# Patient Record
Sex: Female | Born: 1935 | Race: White | Hispanic: No | Smoking: Former smoker
Health system: Southern US, Community
[De-identification: ages and names within clinical notes are randomized; demographics above are authoritative.]

## PROBLEM LIST (undated history)

## (undated) DIAGNOSIS — I10 Essential (primary) hypertension: Secondary | ICD-10-CM

## (undated) DIAGNOSIS — E119 Type 2 diabetes mellitus without complications: Secondary | ICD-10-CM

## (undated) HISTORY — PX: FOOT SURGERY: SHX648

## (undated) HISTORY — PX: APPENDECTOMY: SHX54

---

## 1997-05-17 ENCOUNTER — Other Ambulatory Visit: Admission: RE | Admit: 1997-05-17 | Discharge: 1997-05-17 | Payer: Self-pay | Admitting: Gynecology

## 1997-11-19 ENCOUNTER — Encounter: Payer: Self-pay | Admitting: Neurological Surgery

## 1997-11-19 ENCOUNTER — Ambulatory Visit (HOSPITAL_COMMUNITY): Admission: RE | Admit: 1997-11-19 | Discharge: 1997-11-19 | Payer: Self-pay | Admitting: Neurological Surgery

## 1998-06-04 ENCOUNTER — Other Ambulatory Visit: Admission: RE | Admit: 1998-06-04 | Discharge: 1998-06-04 | Payer: Self-pay | Admitting: Gynecology

## 1998-10-20 ENCOUNTER — Encounter: Payer: Self-pay | Admitting: Emergency Medicine

## 1998-10-20 ENCOUNTER — Encounter: Payer: Self-pay | Admitting: Surgery

## 1998-10-20 ENCOUNTER — Inpatient Hospital Stay (HOSPITAL_COMMUNITY): Admission: EM | Admit: 1998-10-20 | Discharge: 1998-10-21 | Payer: Self-pay | Admitting: Emergency Medicine

## 1999-06-10 ENCOUNTER — Other Ambulatory Visit: Admission: RE | Admit: 1999-06-10 | Discharge: 1999-06-10 | Payer: Self-pay | Admitting: Gynecology

## 2000-06-17 ENCOUNTER — Other Ambulatory Visit: Admission: RE | Admit: 2000-06-17 | Discharge: 2000-06-17 | Payer: Self-pay | Admitting: Gynecology

## 2000-12-16 ENCOUNTER — Other Ambulatory Visit: Admission: RE | Admit: 2000-12-16 | Discharge: 2000-12-16 | Payer: Self-pay | Admitting: Gynecology

## 2001-06-23 ENCOUNTER — Other Ambulatory Visit: Admission: RE | Admit: 2001-06-23 | Discharge: 2001-06-23 | Payer: Self-pay | Admitting: Gynecology

## 2002-07-10 ENCOUNTER — Other Ambulatory Visit: Admission: RE | Admit: 2002-07-10 | Discharge: 2002-07-10 | Payer: Self-pay | Admitting: Gynecology

## 2003-07-30 ENCOUNTER — Other Ambulatory Visit: Admission: RE | Admit: 2003-07-30 | Discharge: 2003-07-30 | Payer: Self-pay | Admitting: Gynecology

## 2004-01-04 ENCOUNTER — Encounter: Admission: RE | Admit: 2004-01-04 | Discharge: 2004-01-04 | Payer: Self-pay | Admitting: Internal Medicine

## 2004-05-28 ENCOUNTER — Encounter: Admission: RE | Admit: 2004-05-28 | Discharge: 2004-05-28 | Payer: Self-pay | Admitting: Internal Medicine

## 2004-06-09 ENCOUNTER — Encounter (HOSPITAL_COMMUNITY): Admission: RE | Admit: 2004-06-09 | Discharge: 2004-09-07 | Payer: Self-pay | Admitting: Geriatric Medicine

## 2010-02-23 ENCOUNTER — Encounter: Payer: Self-pay | Admitting: Geriatric Medicine

## 2010-04-18 ENCOUNTER — Other Ambulatory Visit: Payer: Self-pay | Admitting: Dermatology

## 2010-12-12 ENCOUNTER — Other Ambulatory Visit: Payer: Self-pay | Admitting: Dermatology

## 2011-04-28 DIAGNOSIS — Z85828 Personal history of other malignant neoplasm of skin: Secondary | ICD-10-CM | POA: Diagnosis not present

## 2011-04-28 DIAGNOSIS — L821 Other seborrheic keratosis: Secondary | ICD-10-CM | POA: Diagnosis not present

## 2011-06-15 DIAGNOSIS — K219 Gastro-esophageal reflux disease without esophagitis: Secondary | ICD-10-CM | POA: Diagnosis not present

## 2011-06-15 DIAGNOSIS — E785 Hyperlipidemia, unspecified: Secondary | ICD-10-CM | POA: Diagnosis not present

## 2011-06-15 DIAGNOSIS — I10 Essential (primary) hypertension: Secondary | ICD-10-CM | POA: Diagnosis not present

## 2011-06-15 DIAGNOSIS — E049 Nontoxic goiter, unspecified: Secondary | ICD-10-CM | POA: Diagnosis not present

## 2011-06-15 DIAGNOSIS — E119 Type 2 diabetes mellitus without complications: Secondary | ICD-10-CM | POA: Diagnosis not present

## 2011-10-27 DIAGNOSIS — L821 Other seborrheic keratosis: Secondary | ICD-10-CM | POA: Diagnosis not present

## 2011-10-27 DIAGNOSIS — Z85828 Personal history of other malignant neoplasm of skin: Secondary | ICD-10-CM | POA: Diagnosis not present

## 2011-11-20 DIAGNOSIS — Z23 Encounter for immunization: Secondary | ICD-10-CM | POA: Diagnosis not present

## 2011-12-23 DIAGNOSIS — Z0000 Encounter for general adult medical examination without abnormal findings: Secondary | ICD-10-CM | POA: Diagnosis not present

## 2011-12-23 DIAGNOSIS — K219 Gastro-esophageal reflux disease without esophagitis: Secondary | ICD-10-CM | POA: Diagnosis not present

## 2011-12-23 DIAGNOSIS — Z1331 Encounter for screening for depression: Secondary | ICD-10-CM | POA: Diagnosis not present

## 2011-12-23 DIAGNOSIS — I10 Essential (primary) hypertension: Secondary | ICD-10-CM | POA: Diagnosis not present

## 2011-12-23 DIAGNOSIS — E042 Nontoxic multinodular goiter: Secondary | ICD-10-CM | POA: Diagnosis not present

## 2011-12-23 DIAGNOSIS — E119 Type 2 diabetes mellitus without complications: Secondary | ICD-10-CM | POA: Diagnosis not present

## 2011-12-25 DIAGNOSIS — H2510 Age-related nuclear cataract, unspecified eye: Secondary | ICD-10-CM | POA: Diagnosis not present

## 2011-12-28 DIAGNOSIS — Z1289 Encounter for screening for malignant neoplasm of other sites: Secondary | ICD-10-CM | POA: Diagnosis not present

## 2011-12-28 DIAGNOSIS — Z1231 Encounter for screening mammogram for malignant neoplasm of breast: Secondary | ICD-10-CM | POA: Diagnosis not present

## 2011-12-28 DIAGNOSIS — Z1212 Encounter for screening for malignant neoplasm of rectum: Secondary | ICD-10-CM | POA: Diagnosis not present

## 2012-06-14 DIAGNOSIS — I10 Essential (primary) hypertension: Secondary | ICD-10-CM | POA: Diagnosis not present

## 2012-06-14 DIAGNOSIS — R300 Dysuria: Secondary | ICD-10-CM | POA: Diagnosis not present

## 2012-06-14 DIAGNOSIS — K219 Gastro-esophageal reflux disease without esophagitis: Secondary | ICD-10-CM | POA: Diagnosis not present

## 2012-06-14 DIAGNOSIS — E785 Hyperlipidemia, unspecified: Secondary | ICD-10-CM | POA: Diagnosis not present

## 2012-06-14 DIAGNOSIS — E119 Type 2 diabetes mellitus without complications: Secondary | ICD-10-CM | POA: Diagnosis not present

## 2012-06-14 DIAGNOSIS — E042 Nontoxic multinodular goiter: Secondary | ICD-10-CM | POA: Diagnosis not present

## 2012-10-25 DIAGNOSIS — L570 Actinic keratosis: Secondary | ICD-10-CM | POA: Diagnosis not present

## 2012-10-25 DIAGNOSIS — D1801 Hemangioma of skin and subcutaneous tissue: Secondary | ICD-10-CM | POA: Diagnosis not present

## 2012-10-25 DIAGNOSIS — L821 Other seborrheic keratosis: Secondary | ICD-10-CM | POA: Diagnosis not present

## 2012-10-25 DIAGNOSIS — Z85828 Personal history of other malignant neoplasm of skin: Secondary | ICD-10-CM | POA: Diagnosis not present

## 2012-10-25 DIAGNOSIS — L819 Disorder of pigmentation, unspecified: Secondary | ICD-10-CM | POA: Diagnosis not present

## 2012-12-02 DIAGNOSIS — Z23 Encounter for immunization: Secondary | ICD-10-CM | POA: Diagnosis not present

## 2012-12-13 DIAGNOSIS — E119 Type 2 diabetes mellitus without complications: Secondary | ICD-10-CM | POA: Diagnosis not present

## 2012-12-13 DIAGNOSIS — M79609 Pain in unspecified limb: Secondary | ICD-10-CM | POA: Diagnosis not present

## 2012-12-13 DIAGNOSIS — I10 Essential (primary) hypertension: Secondary | ICD-10-CM | POA: Diagnosis not present

## 2012-12-13 DIAGNOSIS — K219 Gastro-esophageal reflux disease without esophagitis: Secondary | ICD-10-CM | POA: Diagnosis not present

## 2013-01-05 ENCOUNTER — Other Ambulatory Visit: Payer: Self-pay | Admitting: Gynecology

## 2013-01-05 DIAGNOSIS — M810 Age-related osteoporosis without current pathological fracture: Secondary | ICD-10-CM | POA: Diagnosis not present

## 2013-01-05 DIAGNOSIS — Z124 Encounter for screening for malignant neoplasm of cervix: Secondary | ICD-10-CM | POA: Diagnosis not present

## 2013-01-05 DIAGNOSIS — Z1231 Encounter for screening mammogram for malignant neoplasm of breast: Secondary | ICD-10-CM | POA: Diagnosis not present

## 2013-01-05 DIAGNOSIS — Z1212 Encounter for screening for malignant neoplasm of rectum: Secondary | ICD-10-CM | POA: Diagnosis not present

## 2013-01-05 DIAGNOSIS — N951 Menopausal and female climacteric states: Secondary | ICD-10-CM | POA: Diagnosis not present

## 2013-02-01 DIAGNOSIS — H2510 Age-related nuclear cataract, unspecified eye: Secondary | ICD-10-CM | POA: Diagnosis not present

## 2013-05-17 DIAGNOSIS — Z8262 Family history of osteoporosis: Secondary | ICD-10-CM | POA: Diagnosis not present

## 2013-05-17 DIAGNOSIS — Z780 Asymptomatic menopausal state: Secondary | ICD-10-CM | POA: Diagnosis not present

## 2013-05-23 DIAGNOSIS — Z85828 Personal history of other malignant neoplasm of skin: Secondary | ICD-10-CM | POA: Diagnosis not present

## 2013-05-23 DIAGNOSIS — B009 Herpesviral infection, unspecified: Secondary | ICD-10-CM | POA: Diagnosis not present

## 2013-06-12 DIAGNOSIS — E119 Type 2 diabetes mellitus without complications: Secondary | ICD-10-CM | POA: Diagnosis not present

## 2013-06-12 DIAGNOSIS — K219 Gastro-esophageal reflux disease without esophagitis: Secondary | ICD-10-CM | POA: Diagnosis not present

## 2013-06-12 DIAGNOSIS — Z23 Encounter for immunization: Secondary | ICD-10-CM | POA: Diagnosis not present

## 2013-06-12 DIAGNOSIS — Z1331 Encounter for screening for depression: Secondary | ICD-10-CM | POA: Diagnosis not present

## 2013-06-12 DIAGNOSIS — I10 Essential (primary) hypertension: Secondary | ICD-10-CM | POA: Diagnosis not present

## 2013-06-12 DIAGNOSIS — R209 Unspecified disturbances of skin sensation: Secondary | ICD-10-CM | POA: Diagnosis not present

## 2013-06-13 DIAGNOSIS — H2510 Age-related nuclear cataract, unspecified eye: Secondary | ICD-10-CM | POA: Diagnosis not present

## 2013-06-13 DIAGNOSIS — H409 Unspecified glaucoma: Secondary | ICD-10-CM | POA: Diagnosis not present

## 2013-06-13 DIAGNOSIS — Z961 Presence of intraocular lens: Secondary | ICD-10-CM | POA: Diagnosis not present

## 2013-06-13 DIAGNOSIS — H4010X0 Unspecified open-angle glaucoma, stage unspecified: Secondary | ICD-10-CM | POA: Diagnosis not present

## 2013-06-13 DIAGNOSIS — E119 Type 2 diabetes mellitus without complications: Secondary | ICD-10-CM | POA: Diagnosis not present

## 2013-10-24 DIAGNOSIS — D1801 Hemangioma of skin and subcutaneous tissue: Secondary | ICD-10-CM | POA: Diagnosis not present

## 2013-10-24 DIAGNOSIS — L820 Inflamed seborrheic keratosis: Secondary | ICD-10-CM | POA: Diagnosis not present

## 2013-10-24 DIAGNOSIS — L570 Actinic keratosis: Secondary | ICD-10-CM | POA: Diagnosis not present

## 2013-10-24 DIAGNOSIS — Z85828 Personal history of other malignant neoplasm of skin: Secondary | ICD-10-CM | POA: Diagnosis not present

## 2013-10-24 DIAGNOSIS — L821 Other seborrheic keratosis: Secondary | ICD-10-CM | POA: Diagnosis not present

## 2013-10-24 DIAGNOSIS — L819 Disorder of pigmentation, unspecified: Secondary | ICD-10-CM | POA: Diagnosis not present

## 2013-11-09 DIAGNOSIS — Z23 Encounter for immunization: Secondary | ICD-10-CM | POA: Diagnosis not present

## 2014-01-03 DIAGNOSIS — H409 Unspecified glaucoma: Secondary | ICD-10-CM | POA: Diagnosis not present

## 2014-01-03 DIAGNOSIS — E119 Type 2 diabetes mellitus without complications: Secondary | ICD-10-CM | POA: Diagnosis not present

## 2014-01-03 DIAGNOSIS — I10 Essential (primary) hypertension: Secondary | ICD-10-CM | POA: Diagnosis not present

## 2014-01-03 DIAGNOSIS — E782 Mixed hyperlipidemia: Secondary | ICD-10-CM | POA: Diagnosis not present

## 2014-01-03 DIAGNOSIS — Z1389 Encounter for screening for other disorder: Secondary | ICD-10-CM | POA: Diagnosis not present

## 2014-01-03 DIAGNOSIS — E042 Nontoxic multinodular goiter: Secondary | ICD-10-CM | POA: Diagnosis not present

## 2014-01-03 DIAGNOSIS — K219 Gastro-esophageal reflux disease without esophagitis: Secondary | ICD-10-CM | POA: Diagnosis not present

## 2014-01-03 DIAGNOSIS — Z0000 Encounter for general adult medical examination without abnormal findings: Secondary | ICD-10-CM | POA: Diagnosis not present

## 2014-01-11 DIAGNOSIS — Z1231 Encounter for screening mammogram for malignant neoplasm of breast: Secondary | ICD-10-CM | POA: Diagnosis not present

## 2014-01-11 DIAGNOSIS — Z1289 Encounter for screening for malignant neoplasm of other sites: Secondary | ICD-10-CM | POA: Diagnosis not present

## 2014-01-17 DIAGNOSIS — H4011X0 Primary open-angle glaucoma, stage unspecified: Secondary | ICD-10-CM | POA: Diagnosis not present

## 2014-01-17 DIAGNOSIS — H2512 Age-related nuclear cataract, left eye: Secondary | ICD-10-CM | POA: Diagnosis not present

## 2014-01-17 DIAGNOSIS — Z961 Presence of intraocular lens: Secondary | ICD-10-CM | POA: Diagnosis not present

## 2014-01-31 DIAGNOSIS — H524 Presbyopia: Secondary | ICD-10-CM | POA: Diagnosis not present

## 2014-01-31 DIAGNOSIS — H2510 Age-related nuclear cataract, unspecified eye: Secondary | ICD-10-CM | POA: Diagnosis not present

## 2014-02-07 DIAGNOSIS — H4011X0 Primary open-angle glaucoma, stage unspecified: Secondary | ICD-10-CM | POA: Diagnosis not present

## 2014-02-07 DIAGNOSIS — H2512 Age-related nuclear cataract, left eye: Secondary | ICD-10-CM | POA: Diagnosis not present

## 2014-02-07 DIAGNOSIS — Z961 Presence of intraocular lens: Secondary | ICD-10-CM | POA: Diagnosis not present

## 2014-02-09 DIAGNOSIS — I10 Essential (primary) hypertension: Secondary | ICD-10-CM | POA: Diagnosis not present

## 2014-06-08 DIAGNOSIS — Z961 Presence of intraocular lens: Secondary | ICD-10-CM | POA: Diagnosis not present

## 2014-06-08 DIAGNOSIS — H4011X0 Primary open-angle glaucoma, stage unspecified: Secondary | ICD-10-CM | POA: Diagnosis not present

## 2014-06-08 DIAGNOSIS — H2512 Age-related nuclear cataract, left eye: Secondary | ICD-10-CM | POA: Diagnosis not present

## 2014-07-11 DIAGNOSIS — E119 Type 2 diabetes mellitus without complications: Secondary | ICD-10-CM | POA: Diagnosis not present

## 2014-07-11 DIAGNOSIS — I10 Essential (primary) hypertension: Secondary | ICD-10-CM | POA: Diagnosis not present

## 2014-07-11 DIAGNOSIS — R300 Dysuria: Secondary | ICD-10-CM | POA: Diagnosis not present

## 2014-07-11 DIAGNOSIS — K219 Gastro-esophageal reflux disease without esophagitis: Secondary | ICD-10-CM | POA: Diagnosis not present

## 2014-07-17 DIAGNOSIS — R300 Dysuria: Secondary | ICD-10-CM | POA: Diagnosis not present

## 2014-07-17 DIAGNOSIS — N952 Postmenopausal atrophic vaginitis: Secondary | ICD-10-CM | POA: Diagnosis not present

## 2014-10-13 DIAGNOSIS — Z23 Encounter for immunization: Secondary | ICD-10-CM | POA: Diagnosis not present

## 2014-10-29 DIAGNOSIS — L820 Inflamed seborrheic keratosis: Secondary | ICD-10-CM | POA: Diagnosis not present

## 2014-10-29 DIAGNOSIS — D1801 Hemangioma of skin and subcutaneous tissue: Secondary | ICD-10-CM | POA: Diagnosis not present

## 2014-10-29 DIAGNOSIS — L821 Other seborrheic keratosis: Secondary | ICD-10-CM | POA: Diagnosis not present

## 2014-10-29 DIAGNOSIS — Z85828 Personal history of other malignant neoplasm of skin: Secondary | ICD-10-CM | POA: Diagnosis not present

## 2014-11-16 DIAGNOSIS — H0289 Other specified disorders of eyelid: Secondary | ICD-10-CM | POA: Diagnosis not present

## 2014-11-16 DIAGNOSIS — H401190 Primary open-angle glaucoma, unspecified eye, stage unspecified: Secondary | ICD-10-CM | POA: Diagnosis not present

## 2014-11-16 DIAGNOSIS — H2512 Age-related nuclear cataract, left eye: Secondary | ICD-10-CM | POA: Diagnosis not present

## 2014-11-16 DIAGNOSIS — Z961 Presence of intraocular lens: Secondary | ICD-10-CM | POA: Diagnosis not present

## 2014-11-16 DIAGNOSIS — H401130 Primary open-angle glaucoma, bilateral, stage unspecified: Secondary | ICD-10-CM | POA: Diagnosis not present

## 2015-01-08 DIAGNOSIS — H43813 Vitreous degeneration, bilateral: Secondary | ICD-10-CM | POA: Diagnosis not present

## 2015-01-08 DIAGNOSIS — H2512 Age-related nuclear cataract, left eye: Secondary | ICD-10-CM | POA: Diagnosis not present

## 2015-01-08 DIAGNOSIS — H401134 Primary open-angle glaucoma, bilateral, indeterminate stage: Secondary | ICD-10-CM | POA: Diagnosis not present

## 2015-01-18 DIAGNOSIS — R3915 Urgency of urination: Secondary | ICD-10-CM | POA: Diagnosis not present

## 2015-01-18 DIAGNOSIS — Z1231 Encounter for screening mammogram for malignant neoplasm of breast: Secondary | ICD-10-CM | POA: Diagnosis not present

## 2015-01-18 DIAGNOSIS — F419 Anxiety disorder, unspecified: Secondary | ICD-10-CM | POA: Diagnosis not present

## 2015-01-30 DIAGNOSIS — H2513 Age-related nuclear cataract, bilateral: Secondary | ICD-10-CM | POA: Diagnosis not present

## 2015-02-11 DIAGNOSIS — E782 Mixed hyperlipidemia: Secondary | ICD-10-CM | POA: Diagnosis not present

## 2015-02-11 DIAGNOSIS — Z1389 Encounter for screening for other disorder: Secondary | ICD-10-CM | POA: Diagnosis not present

## 2015-02-11 DIAGNOSIS — I10 Essential (primary) hypertension: Secondary | ICD-10-CM | POA: Diagnosis not present

## 2015-02-11 DIAGNOSIS — E119 Type 2 diabetes mellitus without complications: Secondary | ICD-10-CM | POA: Diagnosis not present

## 2015-02-11 DIAGNOSIS — R202 Paresthesia of skin: Secondary | ICD-10-CM | POA: Diagnosis not present

## 2015-02-20 DIAGNOSIS — J029 Acute pharyngitis, unspecified: Secondary | ICD-10-CM | POA: Diagnosis not present

## 2015-02-20 DIAGNOSIS — R05 Cough: Secondary | ICD-10-CM | POA: Diagnosis not present

## 2015-03-22 DIAGNOSIS — H401134 Primary open-angle glaucoma, bilateral, indeterminate stage: Secondary | ICD-10-CM | POA: Diagnosis not present

## 2015-04-09 DIAGNOSIS — H401132 Primary open-angle glaucoma, bilateral, moderate stage: Secondary | ICD-10-CM | POA: Diagnosis not present

## 2015-04-29 DIAGNOSIS — R197 Diarrhea, unspecified: Secondary | ICD-10-CM | POA: Diagnosis not present

## 2015-05-03 DIAGNOSIS — H401134 Primary open-angle glaucoma, bilateral, indeterminate stage: Secondary | ICD-10-CM | POA: Diagnosis not present

## 2015-05-29 ENCOUNTER — Encounter (HOSPITAL_COMMUNITY): Payer: Self-pay | Admitting: Family Medicine

## 2015-05-29 ENCOUNTER — Emergency Department (HOSPITAL_COMMUNITY)
Admission: EM | Admit: 2015-05-29 | Discharge: 2015-05-29 | Disposition: A | Discharge status: Home / Self Care | Payer: Medicare Other | Attending: Emergency Medicine | Admitting: Emergency Medicine

## 2015-05-29 DIAGNOSIS — N3091 Cystitis, unspecified with hematuria: Secondary | ICD-10-CM | POA: Insufficient documentation

## 2015-05-29 DIAGNOSIS — E119 Type 2 diabetes mellitus without complications: Secondary | ICD-10-CM | POA: Insufficient documentation

## 2015-05-29 DIAGNOSIS — Z7982 Long term (current) use of aspirin: Secondary | ICD-10-CM | POA: Diagnosis not present

## 2015-05-29 DIAGNOSIS — Z7984 Long term (current) use of oral hypoglycemic drugs: Secondary | ICD-10-CM | POA: Diagnosis not present

## 2015-05-29 DIAGNOSIS — R350 Frequency of micturition: Secondary | ICD-10-CM | POA: Diagnosis present

## 2015-05-29 DIAGNOSIS — Z79899 Other long term (current) drug therapy: Secondary | ICD-10-CM | POA: Diagnosis not present

## 2015-05-29 DIAGNOSIS — N3090 Cystitis, unspecified without hematuria: Secondary | ICD-10-CM

## 2015-05-29 DIAGNOSIS — I10 Essential (primary) hypertension: Secondary | ICD-10-CM | POA: Insufficient documentation

## 2015-05-29 HISTORY — DX: Essential (primary) hypertension: I10

## 2015-05-29 HISTORY — DX: Type 2 diabetes mellitus without complications: E11.9

## 2015-05-29 LAB — URINE MICROSCOPIC-ADD ON

## 2015-05-29 LAB — URINALYSIS, ROUTINE W REFLEX MICROSCOPIC
Bilirubin Urine: NEGATIVE
Glucose, UA: NEGATIVE mg/dL
KETONES UR: NEGATIVE mg/dL
NITRITE: NEGATIVE
PROTEIN: 100 mg/dL — AB
Specific Gravity, Urine: 1.006 (ref 1.005–1.030)
pH: 6.5 (ref 5.0–8.0)

## 2015-05-29 MED ORDER — CEFDINIR 125 MG/5ML PO SUSR
300.0000 mg | Freq: Once | ORAL | Status: AC
  Administered 2015-05-29: 300 mg via ORAL
  Filled 2015-05-29: qty 15

## 2015-05-29 MED ORDER — CEFDINIR 300 MG PO CAPS: 300.0000 mg | ORAL_CAPSULE | Freq: Two times a day (BID) | ORAL | Status: DC

## 2015-05-29 NOTE — ED Notes (Signed)
Pt here for pain with urination and blood in urine.

## 2015-05-29 NOTE — ED Notes (Signed)
Requested [employer] from main pharmacy

## 2015-05-29 NOTE — Discharge Instructions (Signed)

## 2015-05-29 NOTE — ED Provider Notes (Signed)
CSN: [id number]     Arrival date & time 05/29/15  1516 History   First MD Initiated Contact with Patient 05/29/15 1830     Chief Complaint  Patient presents with  . Urinary Frequency     Patient is a 80 y.o. female presenting with frequency. The history is provided by the patient. No language interpreter was used.  Urinary Frequency   Debra Hudson is a 80 y.o. female who presents to the Emergency Department complaining of dysuria.  She reports 1 day of dysuria with hematuria. She has increased urinary frequency with small amounts voided at the time. She denies any fevers, nausea, vomiting, abdominal pain, back pain. Symptoms are mild and constant.  Past Medical History  Diagnosis Date  . Hypertension   . Diabetes mellitus without complication ([place])    History reviewed. No pertinent past surgical history. History reviewed. No pertinent family history. Social History  Substance Use Topics  . Smoking status: Never Smoker   . Smokeless tobacco: None  . Alcohol Use: None   OB History    No data available     Review of Systems  Genitourinary: Positive for frequency.  All other systems reviewed and are negative.     Allergies  Erythromycin  Home Medications   Prior to Admission medications   Medication Sig Start Date End Date Taking? Authorizing Provider  aspirin EC 81 MG tablet Take 81 mg by mouth daily.   Yes Historical Provider, MD  dorzolamide-timolol (COSOPT) 22.3-6.8 MG/ML ophthalmic solution Place 1 drop into both eyes 2 (two) times daily. 05/21/15  Yes Historical Provider, MD  gabapentin (NEURONTIN) 100 MG capsule Take 100 mg by mouth at bedtime. 04/09/15  Yes Historical Provider, MD  losartan-hydrochlorothiazide (HYZAAR) 100-25 MG tablet Take 1 tablet by mouth daily. 05/01/15  Yes Historical Provider, MD  metFORMIN (GLUCOPHAGE) 500 MG tablet Take 500 mg by mouth every morning. 03/20/15  Yes Historical Provider, MD  metoprolol tartrate (LOPRESSOR) 25 MG tablet Take  25 mg by mouth 2 (two) times daily. 04/15/15  Yes Historical Provider, MD  potassium chloride (MICRO-K) 10 MEQ CR capsule Take 10 mEq by mouth daily. 03/12/15  Yes Historical Provider, MD  simvastatin (ZOCOR) 40 MG tablet Take 40 mg by mouth daily at 6 PM.  03/04/15  Yes Historical Provider, MD  TRAVATAN Z 0.004 % SOLN ophthalmic solution Place 1 drop into both eyes at bedtime. 03/22/15  Yes Historical Provider, MD  cefdinir (OMNICEF) 300 MG capsule Take 1 capsule (300 mg total) by mouth 2 (two) times daily. 05/29/15   Quintella Reichert, MD   BP 134/54 mmHg  Pulse 60  Temp(Src) 97.3 F (36.3 C) (Oral)  Resp 16  Ht 5\' 4"  (1.626 m)  Wt 153 lb 1.6 oz (69.446 kg)  BMI 26.27 kg/m2  SpO2 100% Physical Exam  Constitutional: She is oriented to person, place, and time. She appears well-developed and well-nourished.  HENT:  Head: Normocephalic and atraumatic.  Cardiovascular: Normal rate and regular rhythm.   No murmur heard. Pulmonary/Chest: Effort normal and breath sounds normal. No respiratory distress.  Abdominal: Soft. There is no tenderness. There is no rebound and no guarding.  Musculoskeletal: She exhibits no edema or tenderness.  Neurological: She is alert and oriented to person, place, and time.  Skin: Skin is warm and dry.  Psychiatric: She has a normal mood and affect. Her behavior is normal.  Nursing note and vitals reviewed.   ED Course  Procedures (including critical care time) Labs Review  Labs Reviewed  URINALYSIS, ROUTINE W REFLEX MICROSCOPIC (NOT AT [facility]) - Abnormal; Notable for the following:    Color, Urine RED (*)    APPearance TURBID (*)    Hgb urine dipstick LARGE (*)    Protein, ur 100 (*)    Leukocytes, UA LARGE (*)    All other components within normal limits  URINE MICROSCOPIC-ADD ON - Abnormal; Notable for the following:    Squamous Epithelial / LPF 0-5 (*)    Bacteria, UA FEW (*)    All other components within normal limits  URINE CULTURE    Imaging  Review No results found. I have personally reviewed and evaluated these images and lab results as part of my medical decision-making.   EKG Interpretation None      MDM   Final diagnoses:  Cystitis    Patient here with dysuria, hematuria. She is nontoxic appearing on examination with no systemic symptoms. UA is consistent with UTI, will treat with antibiotics. Discussed home care, outpatient follow-up, close return precautions.    Quintella Reichert, MD 05/30/15 [phone]

## 2015-05-30 LAB — URINE CULTURE: Culture: 9000 — AB

## 2015-06-06 DIAGNOSIS — N390 Urinary tract infection, site not specified: Secondary | ICD-10-CM | POA: Diagnosis not present

## 2015-08-12 DIAGNOSIS — I10 Essential (primary) hypertension: Secondary | ICD-10-CM | POA: Diagnosis not present

## 2015-08-12 DIAGNOSIS — E119 Type 2 diabetes mellitus without complications: Secondary | ICD-10-CM | POA: Diagnosis not present

## 2015-08-12 DIAGNOSIS — R202 Paresthesia of skin: Secondary | ICD-10-CM | POA: Diagnosis not present

## 2015-08-12 DIAGNOSIS — K219 Gastro-esophageal reflux disease without esophagitis: Secondary | ICD-10-CM | POA: Diagnosis not present

## 2015-08-12 DIAGNOSIS — F419 Anxiety disorder, unspecified: Secondary | ICD-10-CM | POA: Diagnosis not present

## 2015-08-12 DIAGNOSIS — Z7984 Long term (current) use of oral hypoglycemic drugs: Secondary | ICD-10-CM | POA: Diagnosis not present

## 2015-10-24 DIAGNOSIS — H401211 Low-tension glaucoma, right eye, mild stage: Secondary | ICD-10-CM | POA: Diagnosis not present

## 2015-10-24 DIAGNOSIS — H401223 Low-tension glaucoma, left eye, severe stage: Secondary | ICD-10-CM | POA: Diagnosis not present

## 2015-11-11 DIAGNOSIS — Z23 Encounter for immunization: Secondary | ICD-10-CM | POA: Diagnosis not present

## 2015-11-21 DIAGNOSIS — H401223 Low-tension glaucoma, left eye, severe stage: Secondary | ICD-10-CM | POA: Diagnosis not present

## 2016-01-02 DIAGNOSIS — H401223 Low-tension glaucoma, left eye, severe stage: Secondary | ICD-10-CM | POA: Diagnosis not present

## 2016-01-02 DIAGNOSIS — H401211 Low-tension glaucoma, right eye, mild stage: Secondary | ICD-10-CM | POA: Diagnosis not present

## 2016-01-14 DIAGNOSIS — E119 Type 2 diabetes mellitus without complications: Secondary | ICD-10-CM | POA: Diagnosis not present

## 2016-01-14 DIAGNOSIS — H401211 Low-tension glaucoma, right eye, mild stage: Secondary | ICD-10-CM | POA: Diagnosis not present

## 2016-01-14 DIAGNOSIS — H401223 Low-tension glaucoma, left eye, severe stage: Secondary | ICD-10-CM | POA: Diagnosis not present

## 2016-01-20 DIAGNOSIS — N905 Atrophy of vulva: Secondary | ICD-10-CM | POA: Diagnosis not present

## 2016-01-20 DIAGNOSIS — N3941 Urge incontinence: Secondary | ICD-10-CM | POA: Diagnosis not present

## 2016-01-20 DIAGNOSIS — R1032 Left lower quadrant pain: Secondary | ICD-10-CM | POA: Diagnosis not present

## 2016-01-20 DIAGNOSIS — Z1231 Encounter for screening mammogram for malignant neoplasm of breast: Secondary | ICD-10-CM | POA: Diagnosis not present

## 2016-02-12 DIAGNOSIS — I10 Essential (primary) hypertension: Secondary | ICD-10-CM | POA: Diagnosis not present

## 2016-02-12 DIAGNOSIS — K219 Gastro-esophageal reflux disease without esophagitis: Secondary | ICD-10-CM | POA: Diagnosis not present

## 2016-02-12 DIAGNOSIS — E782 Mixed hyperlipidemia: Secondary | ICD-10-CM | POA: Diagnosis not present

## 2016-02-12 DIAGNOSIS — R202 Paresthesia of skin: Secondary | ICD-10-CM | POA: Diagnosis not present

## 2016-02-12 DIAGNOSIS — Z1389 Encounter for screening for other disorder: Secondary | ICD-10-CM | POA: Diagnosis not present

## 2016-02-12 DIAGNOSIS — E042 Nontoxic multinodular goiter: Secondary | ICD-10-CM | POA: Diagnosis not present

## 2016-02-12 DIAGNOSIS — E119 Type 2 diabetes mellitus without complications: Secondary | ICD-10-CM | POA: Diagnosis not present

## 2016-02-12 DIAGNOSIS — Z0000 Encounter for general adult medical examination without abnormal findings: Secondary | ICD-10-CM | POA: Diagnosis not present

## 2016-02-18 DIAGNOSIS — H401223 Low-tension glaucoma, left eye, severe stage: Secondary | ICD-10-CM | POA: Diagnosis not present

## 2016-02-18 DIAGNOSIS — H401211 Low-tension glaucoma, right eye, mild stage: Secondary | ICD-10-CM | POA: Diagnosis not present

## 2016-03-19 DIAGNOSIS — R350 Frequency of micturition: Secondary | ICD-10-CM | POA: Diagnosis not present

## 2016-03-19 DIAGNOSIS — M545 Low back pain: Secondary | ICD-10-CM | POA: Diagnosis not present

## 2016-03-25 DIAGNOSIS — R300 Dysuria: Secondary | ICD-10-CM | POA: Diagnosis not present

## 2016-03-25 DIAGNOSIS — R1032 Left lower quadrant pain: Secondary | ICD-10-CM | POA: Diagnosis not present

## 2016-05-19 DIAGNOSIS — H401211 Low-tension glaucoma, right eye, mild stage: Secondary | ICD-10-CM | POA: Diagnosis not present

## 2016-05-19 DIAGNOSIS — H401223 Low-tension glaucoma, left eye, severe stage: Secondary | ICD-10-CM | POA: Diagnosis not present

## 2016-06-22 DIAGNOSIS — D1801 Hemangioma of skin and subcutaneous tissue: Secondary | ICD-10-CM | POA: Diagnosis not present

## 2016-06-22 DIAGNOSIS — L821 Other seborrheic keratosis: Secondary | ICD-10-CM | POA: Diagnosis not present

## 2016-06-22 DIAGNOSIS — L438 Other lichen planus: Secondary | ICD-10-CM | POA: Diagnosis not present

## 2016-06-22 DIAGNOSIS — Z85828 Personal history of other malignant neoplasm of skin: Secondary | ICD-10-CM | POA: Diagnosis not present

## 2016-08-27 DIAGNOSIS — F329 Major depressive disorder, single episode, unspecified: Secondary | ICD-10-CM | POA: Diagnosis not present

## 2016-08-27 DIAGNOSIS — E119 Type 2 diabetes mellitus without complications: Secondary | ICD-10-CM | POA: Diagnosis not present

## 2016-08-27 DIAGNOSIS — Z7984 Long term (current) use of oral hypoglycemic drugs: Secondary | ICD-10-CM | POA: Diagnosis not present

## 2016-08-27 DIAGNOSIS — E1165 Type 2 diabetes mellitus with hyperglycemia: Secondary | ICD-10-CM | POA: Diagnosis not present

## 2016-08-27 DIAGNOSIS — I10 Essential (primary) hypertension: Secondary | ICD-10-CM | POA: Diagnosis not present

## 2016-08-27 DIAGNOSIS — M79672 Pain in left foot: Secondary | ICD-10-CM | POA: Diagnosis not present

## 2016-08-27 DIAGNOSIS — Z79899 Other long term (current) drug therapy: Secondary | ICD-10-CM | POA: Diagnosis not present

## 2016-09-29 DIAGNOSIS — H401211 Low-tension glaucoma, right eye, mild stage: Secondary | ICD-10-CM | POA: Diagnosis not present

## 2016-09-29 DIAGNOSIS — H401223 Low-tension glaucoma, left eye, severe stage: Secondary | ICD-10-CM | POA: Diagnosis not present

## 2016-09-29 DIAGNOSIS — H25812 Combined forms of age-related cataract, left eye: Secondary | ICD-10-CM | POA: Diagnosis not present

## 2016-10-29 DIAGNOSIS — Z5181 Encounter for therapeutic drug level monitoring: Secondary | ICD-10-CM | POA: Diagnosis not present

## 2016-10-29 DIAGNOSIS — E538 Deficiency of other specified B group vitamins: Secondary | ICD-10-CM | POA: Diagnosis not present

## 2016-10-30 DIAGNOSIS — Z23 Encounter for immunization: Secondary | ICD-10-CM | POA: Diagnosis not present

## 2016-10-30 DIAGNOSIS — S00512A Abrasion of oral cavity, initial encounter: Secondary | ICD-10-CM | POA: Diagnosis not present

## 2016-10-30 DIAGNOSIS — B370 Candidal stomatitis: Secondary | ICD-10-CM | POA: Diagnosis not present

## 2016-12-30 DIAGNOSIS — H401211 Low-tension glaucoma, right eye, mild stage: Secondary | ICD-10-CM | POA: Diagnosis not present

## 2016-12-30 DIAGNOSIS — H25812 Combined forms of age-related cataract, left eye: Secondary | ICD-10-CM | POA: Diagnosis not present

## 2016-12-30 DIAGNOSIS — H401223 Low-tension glaucoma, left eye, severe stage: Secondary | ICD-10-CM | POA: Diagnosis not present

## 2017-01-21 DIAGNOSIS — E119 Type 2 diabetes mellitus without complications: Secondary | ICD-10-CM | POA: Diagnosis not present

## 2017-02-18 DIAGNOSIS — E042 Nontoxic multinodular goiter: Secondary | ICD-10-CM | POA: Diagnosis not present

## 2017-02-18 DIAGNOSIS — R202 Paresthesia of skin: Secondary | ICD-10-CM | POA: Diagnosis not present

## 2017-02-18 DIAGNOSIS — Z0000 Encounter for general adult medical examination without abnormal findings: Secondary | ICD-10-CM | POA: Diagnosis not present

## 2017-02-18 DIAGNOSIS — F329 Major depressive disorder, single episode, unspecified: Secondary | ICD-10-CM | POA: Diagnosis not present

## 2017-02-18 DIAGNOSIS — I10 Essential (primary) hypertension: Secondary | ICD-10-CM | POA: Diagnosis not present

## 2017-02-18 DIAGNOSIS — E782 Mixed hyperlipidemia: Secondary | ICD-10-CM | POA: Diagnosis not present

## 2017-02-18 DIAGNOSIS — E119 Type 2 diabetes mellitus without complications: Secondary | ICD-10-CM | POA: Diagnosis not present

## 2017-02-18 DIAGNOSIS — Z1389 Encounter for screening for other disorder: Secondary | ICD-10-CM | POA: Diagnosis not present

## 2017-02-18 DIAGNOSIS — K219 Gastro-esophageal reflux disease without esophagitis: Secondary | ICD-10-CM | POA: Diagnosis not present

## 2017-02-25 DIAGNOSIS — Z1231 Encounter for screening mammogram for malignant neoplasm of breast: Secondary | ICD-10-CM | POA: Diagnosis not present

## 2017-02-25 DIAGNOSIS — N905 Atrophy of vulva: Secondary | ICD-10-CM | POA: Diagnosis not present

## 2017-02-25 DIAGNOSIS — R351 Nocturia: Secondary | ICD-10-CM | POA: Diagnosis not present

## 2017-03-02 DIAGNOSIS — J069 Acute upper respiratory infection, unspecified: Secondary | ICD-10-CM | POA: Diagnosis not present

## 2017-03-02 DIAGNOSIS — R0989 Other specified symptoms and signs involving the circulatory and respiratory systems: Secondary | ICD-10-CM | POA: Diagnosis not present

## 2017-03-02 DIAGNOSIS — R05 Cough: Secondary | ICD-10-CM | POA: Diagnosis not present

## 2017-04-30 DIAGNOSIS — H401223 Low-tension glaucoma, left eye, severe stage: Secondary | ICD-10-CM | POA: Diagnosis not present

## 2017-04-30 DIAGNOSIS — H401212 Low-tension glaucoma, right eye, moderate stage: Secondary | ICD-10-CM | POA: Diagnosis not present

## 2017-05-20 DIAGNOSIS — E119 Type 2 diabetes mellitus without complications: Secondary | ICD-10-CM | POA: Diagnosis not present

## 2017-06-22 DIAGNOSIS — L820 Inflamed seborrheic keratosis: Secondary | ICD-10-CM | POA: Diagnosis not present

## 2017-06-22 DIAGNOSIS — L812 Freckles: Secondary | ICD-10-CM | POA: Diagnosis not present

## 2017-06-22 DIAGNOSIS — Z85828 Personal history of other malignant neoplasm of skin: Secondary | ICD-10-CM | POA: Diagnosis not present

## 2017-06-22 DIAGNOSIS — D1801 Hemangioma of skin and subcutaneous tissue: Secondary | ICD-10-CM | POA: Diagnosis not present

## 2017-06-22 DIAGNOSIS — L821 Other seborrheic keratosis: Secondary | ICD-10-CM | POA: Diagnosis not present

## 2017-08-10 DIAGNOSIS — H401223 Low-tension glaucoma, left eye, severe stage: Secondary | ICD-10-CM | POA: Diagnosis not present

## 2017-08-26 DIAGNOSIS — Z7984 Long term (current) use of oral hypoglycemic drugs: Secondary | ICD-10-CM | POA: Diagnosis not present

## 2017-08-26 DIAGNOSIS — I10 Essential (primary) hypertension: Secondary | ICD-10-CM | POA: Diagnosis not present

## 2017-08-26 DIAGNOSIS — E1169 Type 2 diabetes mellitus with other specified complication: Secondary | ICD-10-CM | POA: Diagnosis not present

## 2017-08-26 DIAGNOSIS — R634 Abnormal weight loss: Secondary | ICD-10-CM | POA: Diagnosis not present

## 2017-08-26 DIAGNOSIS — R110 Nausea: Secondary | ICD-10-CM | POA: Diagnosis not present

## 2017-08-26 DIAGNOSIS — F329 Major depressive disorder, single episode, unspecified: Secondary | ICD-10-CM | POA: Diagnosis not present

## 2017-08-26 DIAGNOSIS — Z6824 Body mass index (BMI) 24.0-24.9, adult: Secondary | ICD-10-CM | POA: Diagnosis not present

## 2017-08-26 DIAGNOSIS — E0590 Thyrotoxicosis, unspecified without thyrotoxic crisis or storm: Secondary | ICD-10-CM | POA: Diagnosis not present

## 2017-09-03 ENCOUNTER — Other Ambulatory Visit (HOSPITAL_COMMUNITY): Payer: Self-pay | Admitting: Internal Medicine

## 2017-09-03 DIAGNOSIS — E042 Nontoxic multinodular goiter: Secondary | ICD-10-CM

## 2017-09-13 ENCOUNTER — Encounter (HOSPITAL_COMMUNITY)
Admission: RE | Admit: 2017-09-13 | Discharge: 2017-09-13 | Disposition: A | Discharge status: Home / Self Care | Payer: Medicare Other | Source: Ambulatory Visit | Attending: Internal Medicine | Admitting: Internal Medicine

## 2017-09-13 DIAGNOSIS — E042 Nontoxic multinodular goiter: Secondary | ICD-10-CM | POA: Diagnosis not present

## 2017-09-13 MED ORDER — SODIUM IODIDE I-123 7.4 MBQ CAPS
425.0000 | ORAL_CAPSULE | Freq: Once | ORAL | Status: AC
  Administered 2017-09-13: 425 via ORAL

## 2017-09-14 ENCOUNTER — Encounter (HOSPITAL_COMMUNITY)
Admission: RE | Admit: 2017-09-14 | Discharge: 2017-09-14 | Disposition: A | Discharge status: Home / Self Care | Payer: Medicare Other | Source: Ambulatory Visit | Attending: Internal Medicine | Admitting: Internal Medicine

## 2017-09-14 DIAGNOSIS — E0590 Thyrotoxicosis, unspecified without thyrotoxic crisis or storm: Secondary | ICD-10-CM | POA: Diagnosis not present

## 2017-11-02 DIAGNOSIS — Z23 Encounter for immunization: Secondary | ICD-10-CM | POA: Diagnosis not present

## 2017-11-09 DIAGNOSIS — H401223 Low-tension glaucoma, left eye, severe stage: Secondary | ICD-10-CM | POA: Diagnosis not present

## 2018-01-21 DIAGNOSIS — H2513 Age-related nuclear cataract, bilateral: Secondary | ICD-10-CM | POA: Diagnosis not present

## 2018-02-28 DIAGNOSIS — Z1231 Encounter for screening mammogram for malignant neoplasm of breast: Secondary | ICD-10-CM | POA: Diagnosis not present

## 2018-03-08 DIAGNOSIS — E782 Mixed hyperlipidemia: Secondary | ICD-10-CM | POA: Diagnosis not present

## 2018-03-08 DIAGNOSIS — Z1389 Encounter for screening for other disorder: Secondary | ICD-10-CM | POA: Diagnosis not present

## 2018-03-08 DIAGNOSIS — I10 Essential (primary) hypertension: Secondary | ICD-10-CM | POA: Diagnosis not present

## 2018-03-08 DIAGNOSIS — E042 Nontoxic multinodular goiter: Secondary | ICD-10-CM | POA: Diagnosis not present

## 2018-03-08 DIAGNOSIS — Z0000 Encounter for general adult medical examination without abnormal findings: Secondary | ICD-10-CM | POA: Diagnosis not present

## 2018-03-08 DIAGNOSIS — K219 Gastro-esophageal reflux disease without esophagitis: Secondary | ICD-10-CM | POA: Diagnosis not present

## 2018-03-08 DIAGNOSIS — E1169 Type 2 diabetes mellitus with other specified complication: Secondary | ICD-10-CM | POA: Diagnosis not present

## 2018-03-08 DIAGNOSIS — Z23 Encounter for immunization: Secondary | ICD-10-CM | POA: Diagnosis not present

## 2018-03-08 DIAGNOSIS — E0590 Thyrotoxicosis, unspecified without thyrotoxic crisis or storm: Secondary | ICD-10-CM | POA: Diagnosis not present

## 2018-03-08 DIAGNOSIS — R202 Paresthesia of skin: Secondary | ICD-10-CM | POA: Diagnosis not present

## 2018-03-18 DIAGNOSIS — H401212 Low-tension glaucoma, right eye, moderate stage: Secondary | ICD-10-CM | POA: Diagnosis not present

## 2018-03-21 DIAGNOSIS — E0590 Thyrotoxicosis, unspecified without thyrotoxic crisis or storm: Secondary | ICD-10-CM | POA: Diagnosis not present

## 2018-03-28 ENCOUNTER — Other Ambulatory Visit: Payer: Self-pay | Admitting: Internal Medicine

## 2018-03-28 DIAGNOSIS — G44309 Post-traumatic headache, unspecified, not intractable: Secondary | ICD-10-CM | POA: Diagnosis not present

## 2018-03-31 ENCOUNTER — Ambulatory Visit
Admission: RE | Admit: 2018-03-31 | Discharge: 2018-03-31 | Disposition: A | Discharge status: Home / Self Care | Payer: Medicare Other | Source: Ambulatory Visit | Attending: Internal Medicine | Admitting: Internal Medicine

## 2018-03-31 DIAGNOSIS — G44309 Post-traumatic headache, unspecified, not intractable: Secondary | ICD-10-CM

## 2018-03-31 DIAGNOSIS — R51 Headache: Secondary | ICD-10-CM | POA: Diagnosis not present

## 2018-03-31 DIAGNOSIS — S0990XA Unspecified injury of head, initial encounter: Secondary | ICD-10-CM | POA: Diagnosis not present

## 2018-05-20 DIAGNOSIS — R509 Fever, unspecified: Secondary | ICD-10-CM | POA: Diagnosis not present

## 2018-06-23 DIAGNOSIS — Z85828 Personal history of other malignant neoplasm of skin: Secondary | ICD-10-CM | POA: Diagnosis not present

## 2018-06-23 DIAGNOSIS — L821 Other seborrheic keratosis: Secondary | ICD-10-CM | POA: Diagnosis not present

## 2018-06-23 DIAGNOSIS — L570 Actinic keratosis: Secondary | ICD-10-CM | POA: Diagnosis not present

## 2018-07-04 DIAGNOSIS — R432 Parageusia: Secondary | ICD-10-CM | POA: Diagnosis not present

## 2018-09-13 DIAGNOSIS — R200 Anesthesia of skin: Secondary | ICD-10-CM | POA: Diagnosis not present

## 2018-09-13 DIAGNOSIS — E1169 Type 2 diabetes mellitus with other specified complication: Secondary | ICD-10-CM | POA: Diagnosis not present

## 2018-09-13 DIAGNOSIS — Z7984 Long term (current) use of oral hypoglycemic drugs: Secondary | ICD-10-CM | POA: Diagnosis not present

## 2018-09-15 DIAGNOSIS — I10 Essential (primary) hypertension: Secondary | ICD-10-CM | POA: Diagnosis not present

## 2018-09-15 DIAGNOSIS — K219 Gastro-esophageal reflux disease without esophagitis: Secondary | ICD-10-CM | POA: Diagnosis not present

## 2018-09-15 DIAGNOSIS — R202 Paresthesia of skin: Secondary | ICD-10-CM | POA: Diagnosis not present

## 2018-09-15 DIAGNOSIS — E1169 Type 2 diabetes mellitus with other specified complication: Secondary | ICD-10-CM | POA: Diagnosis not present

## 2018-09-16 DIAGNOSIS — H401212 Low-tension glaucoma, right eye, moderate stage: Secondary | ICD-10-CM | POA: Diagnosis not present

## 2018-09-16 DIAGNOSIS — H401223 Low-tension glaucoma, left eye, severe stage: Secondary | ICD-10-CM | POA: Diagnosis not present

## 2018-09-25 DIAGNOSIS — N3000 Acute cystitis without hematuria: Secondary | ICD-10-CM | POA: Diagnosis not present

## 2018-10-04 DIAGNOSIS — R300 Dysuria: Secondary | ICD-10-CM | POA: Diagnosis not present

## 2018-10-04 DIAGNOSIS — Z23 Encounter for immunization: Secondary | ICD-10-CM | POA: Diagnosis not present

## 2018-10-31 DIAGNOSIS — H409 Unspecified glaucoma: Secondary | ICD-10-CM | POA: Diagnosis not present

## 2018-10-31 DIAGNOSIS — E1169 Type 2 diabetes mellitus with other specified complication: Secondary | ICD-10-CM | POA: Diagnosis not present

## 2018-10-31 DIAGNOSIS — E782 Mixed hyperlipidemia: Secondary | ICD-10-CM | POA: Diagnosis not present

## 2018-10-31 DIAGNOSIS — M1990 Unspecified osteoarthritis, unspecified site: Secondary | ICD-10-CM | POA: Diagnosis not present

## 2018-10-31 DIAGNOSIS — Z7984 Long term (current) use of oral hypoglycemic drugs: Secondary | ICD-10-CM | POA: Diagnosis not present

## 2018-10-31 DIAGNOSIS — I10 Essential (primary) hypertension: Secondary | ICD-10-CM | POA: Diagnosis not present

## 2018-11-28 DIAGNOSIS — M546 Pain in thoracic spine: Secondary | ICD-10-CM | POA: Diagnosis not present

## 2018-12-07 DIAGNOSIS — K219 Gastro-esophageal reflux disease without esophagitis: Secondary | ICD-10-CM | POA: Diagnosis not present

## 2018-12-07 DIAGNOSIS — R142 Eructation: Secondary | ICD-10-CM | POA: Diagnosis not present

## 2019-01-02 DIAGNOSIS — I10 Essential (primary) hypertension: Secondary | ICD-10-CM | POA: Diagnosis not present

## 2019-01-02 DIAGNOSIS — E1169 Type 2 diabetes mellitus with other specified complication: Secondary | ICD-10-CM | POA: Diagnosis not present

## 2019-01-02 DIAGNOSIS — H409 Unspecified glaucoma: Secondary | ICD-10-CM | POA: Diagnosis not present

## 2019-01-02 DIAGNOSIS — E782 Mixed hyperlipidemia: Secondary | ICD-10-CM | POA: Diagnosis not present

## 2019-01-02 DIAGNOSIS — M1990 Unspecified osteoarthritis, unspecified site: Secondary | ICD-10-CM | POA: Diagnosis not present

## 2019-01-05 DIAGNOSIS — N390 Urinary tract infection, site not specified: Secondary | ICD-10-CM | POA: Diagnosis not present

## 2019-02-02 DIAGNOSIS — E782 Mixed hyperlipidemia: Secondary | ICD-10-CM | POA: Diagnosis not present

## 2019-02-02 DIAGNOSIS — M1990 Unspecified osteoarthritis, unspecified site: Secondary | ICD-10-CM | POA: Diagnosis not present

## 2019-02-02 DIAGNOSIS — H409 Unspecified glaucoma: Secondary | ICD-10-CM | POA: Diagnosis not present

## 2019-02-02 DIAGNOSIS — Z7984 Long term (current) use of oral hypoglycemic drugs: Secondary | ICD-10-CM | POA: Diagnosis not present

## 2019-02-02 DIAGNOSIS — E1169 Type 2 diabetes mellitus with other specified complication: Secondary | ICD-10-CM | POA: Diagnosis not present

## 2019-02-02 DIAGNOSIS — I10 Essential (primary) hypertension: Secondary | ICD-10-CM | POA: Diagnosis not present

## 2019-03-14 DIAGNOSIS — E042 Nontoxic multinodular goiter: Secondary | ICD-10-CM | POA: Diagnosis not present

## 2019-03-14 DIAGNOSIS — K219 Gastro-esophageal reflux disease without esophagitis: Secondary | ICD-10-CM | POA: Diagnosis not present

## 2019-03-14 DIAGNOSIS — R5383 Other fatigue: Secondary | ICD-10-CM | POA: Diagnosis not present

## 2019-03-14 DIAGNOSIS — E0590 Thyrotoxicosis, unspecified without thyrotoxic crisis or storm: Secondary | ICD-10-CM | POA: Diagnosis not present

## 2019-03-14 DIAGNOSIS — E1169 Type 2 diabetes mellitus with other specified complication: Secondary | ICD-10-CM | POA: Diagnosis not present

## 2019-03-14 DIAGNOSIS — E782 Mixed hyperlipidemia: Secondary | ICD-10-CM | POA: Diagnosis not present

## 2019-03-14 DIAGNOSIS — R202 Paresthesia of skin: Secondary | ICD-10-CM | POA: Diagnosis not present

## 2019-03-14 DIAGNOSIS — Z0000 Encounter for general adult medical examination without abnormal findings: Secondary | ICD-10-CM | POA: Diagnosis not present

## 2019-03-14 DIAGNOSIS — I10 Essential (primary) hypertension: Secondary | ICD-10-CM | POA: Diagnosis not present

## 2019-03-14 DIAGNOSIS — Z1389 Encounter for screening for other disorder: Secondary | ICD-10-CM | POA: Diagnosis not present

## 2019-03-14 DIAGNOSIS — F320 Major depressive disorder, single episode, mild: Secondary | ICD-10-CM | POA: Diagnosis not present

## 2019-03-15 DIAGNOSIS — H25812 Combined forms of age-related cataract, left eye: Secondary | ICD-10-CM | POA: Diagnosis not present

## 2019-03-15 DIAGNOSIS — H401211 Low-tension glaucoma, right eye, mild stage: Secondary | ICD-10-CM | POA: Diagnosis not present

## 2019-03-15 DIAGNOSIS — E119 Type 2 diabetes mellitus without complications: Secondary | ICD-10-CM | POA: Diagnosis not present

## 2019-03-15 DIAGNOSIS — H401223 Low-tension glaucoma, left eye, severe stage: Secondary | ICD-10-CM | POA: Diagnosis not present

## 2019-04-27 DIAGNOSIS — F325 Major depressive disorder, single episode, in full remission: Secondary | ICD-10-CM | POA: Diagnosis not present

## 2019-04-27 DIAGNOSIS — R634 Abnormal weight loss: Secondary | ICD-10-CM | POA: Diagnosis not present

## 2019-05-01 DIAGNOSIS — H409 Unspecified glaucoma: Secondary | ICD-10-CM | POA: Diagnosis not present

## 2019-05-01 DIAGNOSIS — I10 Essential (primary) hypertension: Secondary | ICD-10-CM | POA: Diagnosis not present

## 2019-05-01 DIAGNOSIS — F325 Major depressive disorder, single episode, in full remission: Secondary | ICD-10-CM | POA: Diagnosis not present

## 2019-05-01 DIAGNOSIS — E1169 Type 2 diabetes mellitus with other specified complication: Secondary | ICD-10-CM | POA: Diagnosis not present

## 2019-05-01 DIAGNOSIS — M1990 Unspecified osteoarthritis, unspecified site: Secondary | ICD-10-CM | POA: Diagnosis not present

## 2019-05-01 DIAGNOSIS — E782 Mixed hyperlipidemia: Secondary | ICD-10-CM | POA: Diagnosis not present

## 2019-05-01 DIAGNOSIS — F320 Major depressive disorder, single episode, mild: Secondary | ICD-10-CM | POA: Diagnosis not present

## 2019-05-31 DIAGNOSIS — Z01419 Encounter for gynecological examination (general) (routine) without abnormal findings: Secondary | ICD-10-CM | POA: Diagnosis not present

## 2019-05-31 DIAGNOSIS — Z1231 Encounter for screening mammogram for malignant neoplasm of breast: Secondary | ICD-10-CM | POA: Diagnosis not present

## 2019-09-12 DIAGNOSIS — E1169 Type 2 diabetes mellitus with other specified complication: Secondary | ICD-10-CM | POA: Diagnosis not present

## 2019-09-12 DIAGNOSIS — G629 Polyneuropathy, unspecified: Secondary | ICD-10-CM | POA: Diagnosis not present

## 2019-09-12 DIAGNOSIS — I10 Essential (primary) hypertension: Secondary | ICD-10-CM | POA: Diagnosis not present

## 2019-09-12 DIAGNOSIS — F325 Major depressive disorder, single episode, in full remission: Secondary | ICD-10-CM | POA: Diagnosis not present

## 2019-09-29 DIAGNOSIS — R609 Edema, unspecified: Secondary | ICD-10-CM | POA: Diagnosis not present

## 2019-10-04 DIAGNOSIS — H401223 Low-tension glaucoma, left eye, severe stage: Secondary | ICD-10-CM | POA: Diagnosis not present

## 2019-10-11 DIAGNOSIS — Z23 Encounter for immunization: Secondary | ICD-10-CM | POA: Diagnosis not present

## 2019-11-28 ENCOUNTER — Other Ambulatory Visit: Payer: Self-pay | Admitting: Physician Assistant

## 2019-11-28 ENCOUNTER — Ambulatory Visit
Admission: RE | Admit: 2019-11-28 | Discharge: 2019-11-28 | Disposition: A | Discharge status: Home / Self Care | Payer: Medicare Other | Source: Ambulatory Visit | Attending: Physician Assistant | Admitting: Physician Assistant

## 2019-11-28 DIAGNOSIS — R0781 Pleurodynia: Secondary | ICD-10-CM

## 2019-11-28 DIAGNOSIS — W19XXXA Unspecified fall, initial encounter: Secondary | ICD-10-CM

## 2019-11-30 DIAGNOSIS — E1169 Type 2 diabetes mellitus with other specified complication: Secondary | ICD-10-CM | POA: Diagnosis not present

## 2019-11-30 DIAGNOSIS — F329 Major depressive disorder, single episode, unspecified: Secondary | ICD-10-CM | POA: Diagnosis not present

## 2019-11-30 DIAGNOSIS — M1990 Unspecified osteoarthritis, unspecified site: Secondary | ICD-10-CM | POA: Diagnosis not present

## 2019-11-30 DIAGNOSIS — E782 Mixed hyperlipidemia: Secondary | ICD-10-CM | POA: Diagnosis not present

## 2019-11-30 DIAGNOSIS — Z23 Encounter for immunization: Secondary | ICD-10-CM | POA: Diagnosis not present

## 2019-11-30 DIAGNOSIS — F325 Major depressive disorder, single episode, in full remission: Secondary | ICD-10-CM | POA: Diagnosis not present

## 2019-11-30 DIAGNOSIS — I10 Essential (primary) hypertension: Secondary | ICD-10-CM | POA: Diagnosis not present

## 2019-11-30 DIAGNOSIS — F320 Major depressive disorder, single episode, mild: Secondary | ICD-10-CM | POA: Diagnosis not present

## 2019-11-30 DIAGNOSIS — H409 Unspecified glaucoma: Secondary | ICD-10-CM | POA: Diagnosis not present

## 2019-12-05 ENCOUNTER — Ambulatory Visit
Admission: RE | Admit: 2019-12-05 | Discharge: 2019-12-05 | Disposition: A | Discharge status: Home / Self Care | Payer: Medicare Other | Source: Ambulatory Visit | Attending: Internal Medicine | Admitting: Internal Medicine

## 2019-12-05 ENCOUNTER — Other Ambulatory Visit: Payer: Self-pay | Admitting: Internal Medicine

## 2019-12-05 DIAGNOSIS — M549 Dorsalgia, unspecified: Secondary | ICD-10-CM

## 2019-12-05 DIAGNOSIS — M4185 Other forms of scoliosis, thoracolumbar region: Secondary | ICD-10-CM | POA: Diagnosis not present

## 2019-12-05 DIAGNOSIS — M25511 Pain in right shoulder: Secondary | ICD-10-CM | POA: Diagnosis not present

## 2019-12-08 ENCOUNTER — Ambulatory Visit
Admission: RE | Admit: 2019-12-08 | Discharge: 2019-12-08 | Disposition: A | Discharge status: Home / Self Care | Payer: Medicare Other | Source: Ambulatory Visit | Attending: Internal Medicine | Admitting: Internal Medicine

## 2019-12-08 ENCOUNTER — Other Ambulatory Visit: Payer: Self-pay | Admitting: Internal Medicine

## 2019-12-08 DIAGNOSIS — R936 Abnormal findings on diagnostic imaging of limbs: Secondary | ICD-10-CM

## 2020-01-05 DIAGNOSIS — R609 Edema, unspecified: Secondary | ICD-10-CM | POA: Diagnosis not present

## 2020-01-11 DIAGNOSIS — M7989 Other specified soft tissue disorders: Secondary | ICD-10-CM | POA: Diagnosis not present

## 2020-01-22 DIAGNOSIS — M7989 Other specified soft tissue disorders: Secondary | ICD-10-CM | POA: Diagnosis not present

## 2020-02-16 DIAGNOSIS — E119 Type 2 diabetes mellitus without complications: Secondary | ICD-10-CM | POA: Diagnosis not present

## 2020-02-20 ENCOUNTER — Other Ambulatory Visit: Payer: Self-pay | Admitting: Internal Medicine

## 2020-02-20 DIAGNOSIS — E1169 Type 2 diabetes mellitus with other specified complication: Secondary | ICD-10-CM | POA: Diagnosis not present

## 2020-02-20 DIAGNOSIS — R519 Headache, unspecified: Secondary | ICD-10-CM

## 2020-02-20 DIAGNOSIS — R2681 Unsteadiness on feet: Secondary | ICD-10-CM | POA: Diagnosis not present

## 2020-02-20 DIAGNOSIS — E782 Mixed hyperlipidemia: Secondary | ICD-10-CM | POA: Diagnosis not present

## 2020-02-20 DIAGNOSIS — E042 Nontoxic multinodular goiter: Secondary | ICD-10-CM | POA: Diagnosis not present

## 2020-02-21 ENCOUNTER — Ambulatory Visit
Admission: RE | Admit: 2020-02-21 | Discharge: 2020-02-21 | Disposition: A | Discharge status: Home / Self Care | Payer: Medicare Other | Source: Ambulatory Visit | Attending: Internal Medicine | Admitting: Internal Medicine

## 2020-02-21 ENCOUNTER — Other Ambulatory Visit: Payer: Self-pay | Admitting: Internal Medicine

## 2020-02-21 DIAGNOSIS — F320 Major depressive disorder, single episode, mild: Secondary | ICD-10-CM | POA: Diagnosis not present

## 2020-02-21 DIAGNOSIS — R519 Headache, unspecified: Secondary | ICD-10-CM

## 2020-02-21 DIAGNOSIS — I10 Essential (primary) hypertension: Secondary | ICD-10-CM | POA: Diagnosis not present

## 2020-02-21 DIAGNOSIS — E782 Mixed hyperlipidemia: Secondary | ICD-10-CM | POA: Diagnosis not present

## 2020-02-21 DIAGNOSIS — H409 Unspecified glaucoma: Secondary | ICD-10-CM | POA: Diagnosis not present

## 2020-02-21 DIAGNOSIS — E1169 Type 2 diabetes mellitus with other specified complication: Secondary | ICD-10-CM | POA: Diagnosis not present

## 2020-02-21 DIAGNOSIS — F329 Major depressive disorder, single episode, unspecified: Secondary | ICD-10-CM | POA: Diagnosis not present

## 2020-02-21 DIAGNOSIS — M1990 Unspecified osteoarthritis, unspecified site: Secondary | ICD-10-CM | POA: Diagnosis not present

## 2020-02-21 DIAGNOSIS — K219 Gastro-esophageal reflux disease without esophagitis: Secondary | ICD-10-CM | POA: Diagnosis not present

## 2020-02-21 DIAGNOSIS — F325 Major depressive disorder, single episode, in full remission: Secondary | ICD-10-CM | POA: Diagnosis not present

## 2020-02-29 ENCOUNTER — Other Ambulatory Visit: Payer: Self-pay

## 2020-02-29 ENCOUNTER — Other Ambulatory Visit: Payer: Self-pay | Admitting: Internal Medicine

## 2020-02-29 ENCOUNTER — Ambulatory Visit: Payer: Medicare Other | Attending: Internal Medicine

## 2020-02-29 DIAGNOSIS — R2681 Unsteadiness on feet: Secondary | ICD-10-CM | POA: Insufficient documentation

## 2020-02-29 DIAGNOSIS — M79675 Pain in left toe(s): Secondary | ICD-10-CM | POA: Diagnosis not present

## 2020-02-29 DIAGNOSIS — M6281 Muscle weakness (generalized): Secondary | ICD-10-CM | POA: Diagnosis not present

## 2020-02-29 DIAGNOSIS — R2689 Other abnormalities of gait and mobility: Secondary | ICD-10-CM

## 2020-02-29 DIAGNOSIS — R293 Abnormal posture: Secondary | ICD-10-CM | POA: Diagnosis not present

## 2020-02-29 NOTE — Patient Instructions (Signed)
Access Code: [email or web address] URL: [email or web address] Date: 02/29/2020 Prepared by: Sherlynn Stalls  Exercises Seated Horizontal Smooth Pursuit - 1 x daily - 7 x weekly - 3 sets - 10 reps Seated Scapular Retraction - 1 x daily - 7 x weekly - 3 sets - 10 reps Sit to Stand with Counter Support - 1 x daily - 7 x weekly - 3 sets - 10 reps Standing Hip Abduction with Counter Support - 1 x daily - 7 x weekly - 3 sets - 10 reps

## 2020-02-29 NOTE — Therapy (Signed)
[place]. [place], [place], [address] Phone: [phone]   Fax:  [phone]  Physical Therapy Evaluation  Patient Details  Name: Debra Hudson MRN: [id number] Date of Birth: 06-11-1935 Referring Provider (PT): Laurann Montana   Encounter Date: 02/29/2020   PT End of Session - 02/29/20 1515    Visit Number 1    Number of Visits 9    Date for PT Re-Evaluation 04/25/20    PT Start Time 1400    PT Stop Time 1450    PT Time Calculation (min) 50 min    Equipment Utilized During Treatment --   Close guarding for safety throughout   Activity Tolerance Patient tolerated treatment well;Patient limited by fatigue;Other (comment)   Pt limited by weakness, imbalance   Behavior During Therapy [facility] for tasks assessed/performed           Past Medical History:  Diagnosis Date  . Diabetes mellitus without complication ([place])   . Hypertension     History reviewed. No pertinent surgical history.  There were no vitals filed for this visit.    Subjective Assessment - 02/29/20 1406    Subjective About 3 weeks ago, Pt Was standing in the house and began to turn and when she did she lost her balance and fell and hit her head on the side of the [place] cabinet. Had a CT scan with Dr Laurann Montana about a week after injury. Was having headaches every day post injury. Otherwise has fallen about 3 times in past 3 months. Has had some difficulty climbing steps and when walking, feels unsteady like she will fall.    Pertinent History Hx of hammer toe left 2nd toe  and bunion left foot surgery a few years ago    How long can you sit comfortably? unlimited    How long can you stand comfortably? >30 min    How long can you walk comfortably? Feels unbalanced all the time with walking    Patient Stated Goals To be able to walk better (very self conscious of the way she walks), get stronger    Currently in Pain? Yes    Pain Score 2     Pain Location  Toe (Comment which one)    Pain Orientation Left    Pain Descriptors / Indicators Sore    Pain Type Chronic pain    Pain Onset More than a month ago    Pain Frequency Constant    Aggravating Factors  Standing and walking on foot, diffiuclty walking in busy areas.    Pain Relieving Factors gel toe cover    Effect of Pain on Daily Activities Difficulty walking in the community              [facility] PT Assessment - 02/29/20 0001      Assessment   Medical Diagnosis Unspecified abnormalities of gait and mobility    Referring Provider (PT) Laurann Montana    Onset Date/Surgical Date --   worsened over past few months   Hand Dominance Right    Next MD Visit --   2nd week february     Precautions   Precautions Fall      Balance Screen   Has the patient fallen in the past 6 months Yes    How many times? 3   all inside the home   Has the patient had a decrease in activity level because of a fear of falling?  Yes    Is  the patient reluctant to leave their home because of a fear of falling?  No      Home Ecologist residence    Living Arrangements Spouse/significant other   townhouse   Available Help at Discharge --   husband   Type of [place] Access Level entry    [place] One level   1 bedroom/storage upstairs   [place] --   has a cane and walker at home (husbands)     Prior Function   Vocation Retired    Leisure watching tv, going out to eat with husband      Cognition   Overall Cognitive Status Within Functional Limits for tasks assessed      Observation/Other Assessments   Other Surveys  Activities of Balance and Confidence Scale    Activities of Balance Confidence Scale (ABC Scale)  30.25%      Coordination   Gross Motor Movements are Fluid and Coordinated No    Coordination and Movement Description Grossly incoordinated steps when walking, slight shaky quality noted. Some difficulty with initiation of certain movements.     Finger Nose Finger Test To be assessed    Heel Shin Test To be assessed      Posture/Postural Control   Posture/Postural Control Postural limitations    Postural Limitations Rounded Shoulders;Forward head;Increased thoracic kyphosis;Posterior pelvic tilt      ROM / Strength   AROM / PROM / Strength Strength      Strength   Strength Assessment Site Hip;Knee;Ankle    Right/Left Hip Right;Left    Right Hip Flexion 3+/5    Left Hip Flexion 3+/5    Right/Left Knee Right;Left   4/5 knee ext   Right/Left Ankle --   3+/5 DF B     Ambulation/Gait   Stairs Yes    Stairs Assistance 6: Modified independent (Device/Increase time)   increase time   Stair Management Technique Two rails;Alternating pattern;Forwards    Height of Stairs --   4 and 6 inch   Gait Comments short step length B,diminished stance time, trunk shifted posteriorly, diminished push off bilaterally, diminished trunk/pelvic and head dissociation      Balance   Balance Assessed Yes    Balance comment MCTSIB: EO Firm= 30 ", EC Firm = 5", EO Firm = 15" mod sway and fearful, EC Foam= unable      Standardized Balance Assessment   Standardized Balance Assessment Berg Balance Test   rhomberg 10" EO, 1" EC, unable to complete on foam     Berg Balance Test   Sit to Stand Needs moderate or maximal assist to stand    Standing Unsupported Able to stand 2 minutes with supervision    Sitting with Back Unsupported but Feet Supported on Floor or Stool Able to sit safely and securely 2 minutes    Stand to Sit Uses backs of legs against chair to control descent    Transfers Able to transfer safely, definite need of hands    Standing Unsupported with Eyes Closed Able to stand 3 seconds   very fearful   Standing Unsupported with Feet Together Able to place feet together independently and stand for 1 minute with supervision   very fearful of LOB   From Standing, Reach Forward with Outstretched Arm Can reach forward >5 cm safely (2")    From  Standing Position, Pick up Object from Floor Able to pick up shoe, needs supervision  From Standing Position, Turn to Look Behind Over each Shoulder Looks behind one side only/other side shows less weight shift    Turn 360 Degrees Able to turn 360 degrees safely but slowly   12 steps, > 30 sec   Standing Unsupported, Alternately Place Feet on Step/Stool Able to complete >2 steps/needs minimal assist    Standing Unsupported, One Foot in [employer] balance while stepping or standing    Standing on One Leg Tries to lift leg/unable to hold 3 seconds but remains standing independently    Total Score 29    Berg comment: high falls risk                      Objective measurements completed on examination: See above findings.               PT Education - 02/29/20 1606    Education Details PT POC and HEP.    Person(s) Educated Patient    Methods Explanation;Demonstration;Handout    Comprehension Verbalized understanding;Returned demonstration            PT Short Term Goals - 02/29/20 1503      PT SHORT TERM GOAL #1   Title Pt will be independent with initial HEP    Time 2    Period Weeks    Status New    Target Date 03/14/20      PT SHORT TERM GOAL #2   Title Pt will demo understanding of falls prevention in the home    Time 2    Period Weeks    Status New    Target Date 03/14/20             PT Long Term Goals - 02/29/20 1503      PT LONG TERM GOAL #1   Title Pt will score at least 75% self confidence for balance on the ABC scale to demonstrate improved confidence with balance during functional mobility.    Baseline 30.25% - low level physical functioning    Time 8    Period Weeks    Status New    Target Date 04/25/20      PT LONG TERM GOAL #2   Title Pt will score at least 50/56 on the berg to demo decreased falls risk    Baseline 39/56    Time 8    Period Weeks    Status New    Target Date 04/25/20      PT LONG TERM GOAL #3   Title Pt  will demo gross BLE strength at least 4+/5    Time 8    Period Weeks    Status New    Target Date 04/25/20      PT LONG TERM GOAL #4   Title Pt will be able to walk within busy clinic environment without LOB.    Time 8    Period Weeks    Status New    Target Date 04/25/20      PT LONG TERM GOAL #5   Title Pt will be able to safely demo floor recovery strategies    Time 8    Period Weeks    Status New    Target Date 04/25/20                  Plan - 02/29/20 1516    Clinical Impression Statement About 3 weeks ago, Pt reports she sustained a fall and hit her head when losing her  balance while turning around in the home. She reports she had a CT scan with Dr Laurann Montana about a week after injury. Was having headaches every day post injury which have now subsided. Her history is significant for  falls with about 3 in past 3 months, 2 of which she has hit her head. Pt presents with general muscle weakness and deconditioning, abnormal posture, abnormal eye tracking (saccadic jumps with horizontal smooth pursuits), decreased balance and coordination. As a result, Zacara has much difficulty with walking both in the home and community environments and has difficulty with stairs. She scored 39/56 on the berg indicating high falls risk at this time. She also scored 30.25% on the Activities-specific Balance Confidence Scale (ABC scale) which indicates low physical functioning at this time. given these findings, pt will benefit from skilled physical therapy to address postural instability, strength, balance, gait and overall functional mobility to decrease falls risk.    Personal Factors and Comorbidities Age;Fitness    Examination-Activity Limitations Locomotion Level;Reach Overhead;Bend;Caring for Others;Squat;Stairs;Stand;Toileting;Dressing;Transfers    Examination-Participation Restrictions Community Activity;Interpersonal Relationship    Stability/Clinical Decision Making Evolving/Moderate  complexity    Clinical Decision Making Moderate    Rehab Potential Good    PT Frequency 1x / week    PT Duration 8 weeks    PT Treatment/Interventions ADLs/Self Care Home Management;Electrical Stimulation;Moist Heat;DME Instruction;Neuromuscular re-education;Balance training;Therapeutic exercise;Therapeutic activities;Patient/family education;Stair training;Gait training;Functional mobility training;Manual techniques;Energy conservation;Taping;Vestibular;Visual/perceptual remediation/compensation    PT Next Visit Plan Follow up on HEP tolerance  and carryover next visit. further formal assessment of LE coordination next visit. Initiate warm up on machines if tolerated, gradually progress LE strength and balance exercises, gait training as tolerated. Assess backwards walking safety and speed.    PT Home Exercise Plan Seated horizontal smooth pursuits, Sit to stands with support infront of counter, scap retraction in sitting, standing hip abduction.    Consulted and Agree with Plan of Care Patient           Patient will benefit from skilled therapeutic intervention in order to improve the following deficits and impairments:  Abnormal gait,Decreased coordination,Difficulty walking,Postural dysfunction,Decreased strength,Decreased mobility,Improper body mechanics,Impaired flexibility,Decreased balance,Pain,Impaired perceived functional ability,Decreased activity tolerance,Decreased endurance  Visit Diagnosis: Unsteadiness on feet - Plan: PT plan of care cert/re-cert  Other abnormalities of gait and mobility - Plan: PT plan of care cert/re-cert  Abnormal posture - Plan: PT plan of care cert/re-cert  Muscle weakness (generalized) - Plan: PT plan of care cert/re-cert  Pain in left toe(s) - Plan: PT plan of care cert/re-cert     Problem List There are no problems to display for this patient.   Hall Busing, PT, DPT 02/29/2020, 4:12 PM  [place]. [place], [place], [address] Phone: [phone]   Fax:  [phone]  Name: Debra Hudson MRN: [id number] Date of Birth: 18-Jan-1936

## 2020-03-05 DIAGNOSIS — E0590 Thyrotoxicosis, unspecified without thyrotoxic crisis or storm: Secondary | ICD-10-CM | POA: Diagnosis not present

## 2020-03-05 DIAGNOSIS — E782 Mixed hyperlipidemia: Secondary | ICD-10-CM | POA: Diagnosis not present

## 2020-03-05 DIAGNOSIS — E042 Nontoxic multinodular goiter: Secondary | ICD-10-CM | POA: Diagnosis not present

## 2020-03-05 DIAGNOSIS — R2681 Unsteadiness on feet: Secondary | ICD-10-CM | POA: Diagnosis not present

## 2020-03-05 DIAGNOSIS — E1169 Type 2 diabetes mellitus with other specified complication: Secondary | ICD-10-CM | POA: Diagnosis not present

## 2020-03-06 ENCOUNTER — Ambulatory Visit: Payer: Medicare Other

## 2020-03-15 ENCOUNTER — Encounter: Payer: Self-pay | Admitting: Physical Therapy

## 2020-03-15 ENCOUNTER — Other Ambulatory Visit: Payer: Self-pay

## 2020-03-15 ENCOUNTER — Ambulatory Visit: Payer: Medicare Other | Attending: Internal Medicine | Admitting: Physical Therapy

## 2020-03-15 DIAGNOSIS — R2689 Other abnormalities of gait and mobility: Secondary | ICD-10-CM | POA: Insufficient documentation

## 2020-03-15 DIAGNOSIS — M6281 Muscle weakness (generalized): Secondary | ICD-10-CM | POA: Diagnosis not present

## 2020-03-15 DIAGNOSIS — R2681 Unsteadiness on feet: Secondary | ICD-10-CM

## 2020-03-15 DIAGNOSIS — M79675 Pain in left toe(s): Secondary | ICD-10-CM | POA: Diagnosis not present

## 2020-03-15 DIAGNOSIS — R293 Abnormal posture: Secondary | ICD-10-CM | POA: Diagnosis not present

## 2020-03-15 NOTE — Therapy (Signed)
[place]. [place], [place], [address] Phone: [phone]   Fax:  [phone]  Physical Therapy Treatment  Patient Details  Name: Debra Hudson MRN: [id number] Date of Birth: [birth year] Referring Provider (PT): Laurann Montana   Encounter Date: 03/15/2020   PT End of Session - 03/15/20 1056    Visit Number 2    Number of Visits 9    Date for PT Re-Evaluation 04/25/20    PT Start Time 4765    PT Stop Time 1058    PT Time Calculation (min) 43 min    Activity Tolerance Patient tolerated treatment well;Patient limited by fatigue;Other (comment)   weakness   Behavior During Therapy WFL for tasks assessed/performed           Past Medical History:  Diagnosis Date  . Diabetes mellitus without complication ([place])   . Hypertension     History reviewed. No pertinent surgical history.  There were no vitals filed for this visit.   Subjective Assessment - 03/15/20 1015    Subjective "Just got a sore toe" Has not been able to do as much as she would like    Currently in Pain? No/denies                             [facility] Adult PT Treatment/Exercise - 03/15/20 0001      High Level Balance   High Level Balance Activities Side stepping;Backward walking;Marching forwards   side step and marching HHA x2     Exercises   Exercises Lumbar      Lumbar Exercises: Aerobic   Recumbent Bike L1.5 x 40min      Lumbar Exercises: Machines for Strengthening   Cybex Knee Extension 5lb 3x5    Cybex Knee Flexion 10lb 2x10      Lumbar Exercises: Standing   Other Standing Lumbar Exercises Alt 4 in box taps 2x 10 HHA X2      Lumbar Exercises: Seated   Sit to Stand 5 reps   x2   Other Seated Lumbar Exercises Seated marches 2lb 2x10    Other Seated Lumbar Exercises Rows red 2x10                    PT Short Term Goals - 02/29/20 1503      PT SHORT TERM GOAL #1   Title Pt will be independent with  initial HEP    Time 2    Period Weeks    Status New    Target Date 03/14/20      PT SHORT TERM GOAL #2   Title Pt will demo understanding of falls prevention in the home    Time 2    Period Weeks    Status New    Target Date 03/14/20             PT Long Term Goals - 02/29/20 1503      PT LONG TERM GOAL #1   Title Pt will score at least 75% self confidence for balance on the ABC scale to demonstrate improved confidence with balance during functional mobility.    Baseline 30.25% - low level physical functioning    Time 8    Period Weeks    Status New    Target Date 04/25/20      PT LONG TERM GOAL #2   Title Pt will score at least 50/56 on the berg to  demo decreased falls risk    Baseline 39/56    Time 8    Period Weeks    Status New    Target Date 04/25/20      PT LONG TERM GOAL #3   Title Pt will demo gross BLE strength at least 4+/5    Time 8    Period Weeks    Status New    Target Date 04/25/20      PT LONG TERM GOAL #4   Title Pt will be able to walk within busy clinic environment without LOB.    Time 8    Period Weeks    Status New    Target Date 04/25/20      PT LONG TERM GOAL #5   Title Pt will be able to safely demo floor recovery strategies    Time 8    Period Weeks    Status New    Target Date 04/25/20                 Plan - 03/15/20 1058    Clinical Impression Statement Pt did well with LE strengthening under light resistance. Min to CGA needed for all balance activities. Cues for anterior weight shift needed with sit to stands. Some posterior trunk leaning noted with alt box taps. Core weakness present with seated rows and marches, evident by posterior trunk lean with these interventions.    Personal Factors and Comorbidities Age;Fitness    Examination-Activity Limitations Locomotion Level;Reach Overhead;Bend;Caring for Others;Squat;Stairs;Stand;Toileting;Dressing;Transfers    Examination-Participation Restrictions Community  Activity;Interpersonal Relationship    Stability/Clinical Decision Making Evolving/Moderate complexity    Rehab Potential Good    PT Frequency 1x / week    PT Duration 8 weeks    PT Treatment/Interventions ADLs/Self Care Home Management;Electrical Stimulation;Moist Heat;DME Instruction;Neuromuscular re-education;Balance training;Therapeutic exercise;Therapeutic activities;Patient/family education;Stair training;Gait training;Functional mobility training;Manual techniques;Energy conservation;Taping;Vestibular;Visual/perceptual remediation/compensation    PT Next Visit Plan Initiate warm up on machines if tolerated, gradually progress LE strength and balance exercises, gait training as tolerated. Assess backwards walking safety and speed.           Patient will benefit from skilled therapeutic intervention in order to improve the following deficits and impairments:  Abnormal gait,Decreased coordination,Difficulty walking,Postural dysfunction,Decreased strength,Decreased mobility,Improper body mechanics,Impaired flexibility,Decreased balance,Pain,Impaired perceived functional ability,Decreased activity tolerance,Decreased endurance  Visit Diagnosis: Other abnormalities of gait and mobility  Abnormal posture  Unsteadiness on feet  Muscle weakness (generalized)  Pain in left toe(s)     Problem List There are no problems to display for this patient.   Scot Jun 03/15/2020, 11:05 AM  [address] [place]. [place], [place], [address] Phone: [phone]   Fax:  [phone]  Name: KENNEISHA COCHRANE MRN: [id number] Date of Birth: 07/12/1935

## 2020-03-18 ENCOUNTER — Ambulatory Visit: Payer: Medicare Other | Admitting: Physical Therapy

## 2020-03-18 ENCOUNTER — Other Ambulatory Visit: Payer: Self-pay

## 2020-03-18 ENCOUNTER — Encounter: Payer: Self-pay | Admitting: Physical Therapy

## 2020-03-18 DIAGNOSIS — R2689 Other abnormalities of gait and mobility: Secondary | ICD-10-CM | POA: Diagnosis not present

## 2020-03-18 DIAGNOSIS — M6281 Muscle weakness (generalized): Secondary | ICD-10-CM

## 2020-03-18 DIAGNOSIS — M79675 Pain in left toe(s): Secondary | ICD-10-CM

## 2020-03-18 DIAGNOSIS — R293 Abnormal posture: Secondary | ICD-10-CM

## 2020-03-18 DIAGNOSIS — R2681 Unsteadiness on feet: Secondary | ICD-10-CM

## 2020-03-18 NOTE — Therapy (Signed)
[place]. [place], [place], [address] Phone: [phone]   Fax:  [phone]  Physical Therapy Treatment  Patient Details  Name: Debra Hudson MRN: [id number] Date of Birth: 09/26/1935 Referring Provider (PT): Laurann Montana   Encounter Date: 03/18/2020   PT End of Session - 03/18/20 1340    Visit Number 3    Number of Visits 9    Date for PT Re-Evaluation 04/25/20    PT Start Time 1300    PT Stop Time 1342    PT Time Calculation (min) 42 min    Activity Tolerance Patient tolerated treatment well    Behavior During Therapy [facility] for tasks assessed/performed           Past Medical History:  Diagnosis Date  . Diabetes mellitus without complication ([place])   . Hypertension     History reviewed. No pertinent surgical history.  There were no vitals filed for this visit.   Subjective Assessment - 03/18/20 1301    Subjective Doing ok, no recent stumbles or falls    Currently in Pain? No/denies                             [facility] Adult PT Treatment/Exercise - 03/18/20 0001      High Level Balance   High Level Balance Activities Side stepping;Backward walking      Lumbar Exercises: Aerobic   Nustep L4 x 6 min      Lumbar Exercises: Machines for Strengthening   Cybex Knee Flexion 15lb 2x10      Lumbar Exercises: Standing   Other Standing Lumbar Exercises resisted gait 20lb x5    Other Standing Lumbar Exercises Alt 4 in box taps x10 with Ue x10 without UE      Lumbar Exercises: Seated   Sit to Stand 5 reps   x3, then 2 on airex   Other Seated Lumbar Exercises Seated marches 2lb 2x10                    PT Short Term Goals - 02/29/20 1503      PT SHORT TERM GOAL #1   Title Pt will be independent with initial HEP    Time 2    Period Weeks    Status New    Target Date 03/14/20      PT SHORT TERM GOAL #2   Title Pt will demo understanding of falls prevention in the home     Time 2    Period Weeks    Status New    Target Date 03/14/20             PT Long Term Goals - 02/29/20 1503      PT LONG TERM GOAL #1   Title Pt will score at least 75% self confidence for balance on the ABC scale to demonstrate improved confidence with balance during functional mobility.    Baseline 30.25% - low level physical functioning    Time 8    Period Weeks    Status New    Target Date 04/25/20      PT LONG TERM GOAL #2   Title Pt will score at least 50/56 on the berg to demo decreased falls risk    Baseline 39/56    Time 8    Period Weeks    Status New    Target Date 04/25/20  PT LONG TERM GOAL #3   Title Pt will demo gross BLE strength at least 4+/5    Time 8    Period Weeks    Status New    Target Date 04/25/20      PT LONG TERM GOAL #4   Title Pt will be able to walk within busy clinic environment without LOB.    Time 8    Period Weeks    Status New    Target Date 04/25/20      PT LONG TERM GOAL #5   Title Pt will be able to safely demo floor recovery strategies    Time 8    Period Weeks    Status New    Target Date 04/25/20                 Plan - 03/18/20 1340    Clinical Impression Statement Pt did well today progression with balance activities  without UE assist. Some hesitation but she did complete 4 in alt step ups, backwards walking, and side steps without UE assist. Increase resistance tolerated with Hs curls. No issues noted with resisted gait, only cues to control the eccentric phase. Some knee adduction noted with sit to stands more so with LE on airex.    Personal Factors and Comorbidities Age;Fitness    Examination-Activity Limitations Locomotion Level;Reach Overhead;Bend;Caring for Others;Squat;Stairs;Stand;Toileting;Dressing;Transfers    Examination-Participation Restrictions Community Activity;Interpersonal Relationship    Stability/Clinical Decision Making Evolving/Moderate complexity    Rehab Potential Good    PT  Frequency 1x / week    PT Treatment/Interventions ADLs/Self Care Home Management;Electrical Stimulation;Moist Heat;DME Instruction;Neuromuscular re-education;Balance training;Therapeutic exercise;Therapeutic activities;Patient/family education;Stair training;Gait training;Functional mobility training;Manual techniques;Energy conservation;Taping;Vestibular;Visual/perceptual remediation/compensation    PT Next Visit Plan gradually progress LE strength and balance exercises, gait training as tolerated. Assess backwards walking safety and speed.           Patient will benefit from skilled therapeutic intervention in order to improve the following deficits and impairments:     Visit Diagnosis: Other abnormalities of gait and mobility  Abnormal posture  Unsteadiness on feet  Muscle weakness (generalized)  Pain in left toe(s)     Problem List There are no problems to display for this patient.   Scot Jun, PTA 03/18/2020, 1:44 PM  [place]. [place], [place], [address] Phone: [phone]   Fax:  [phone]  Name: Debra Hudson MRN: [id number] Date of Birth: [birth year]

## 2020-03-19 DIAGNOSIS — R300 Dysuria: Secondary | ICD-10-CM | POA: Diagnosis not present

## 2020-03-19 DIAGNOSIS — I471 Supraventricular tachycardia: Secondary | ICD-10-CM | POA: Diagnosis not present

## 2020-03-19 DIAGNOSIS — F419 Anxiety disorder, unspecified: Secondary | ICD-10-CM | POA: Diagnosis not present

## 2020-03-19 DIAGNOSIS — E1169 Type 2 diabetes mellitus with other specified complication: Secondary | ICD-10-CM | POA: Diagnosis not present

## 2020-03-19 DIAGNOSIS — E0590 Thyrotoxicosis, unspecified without thyrotoxic crisis or storm: Secondary | ICD-10-CM | POA: Diagnosis not present

## 2020-03-19 DIAGNOSIS — Z0000 Encounter for general adult medical examination without abnormal findings: Secondary | ICD-10-CM | POA: Diagnosis not present

## 2020-03-19 DIAGNOSIS — R822 Biliuria: Secondary | ICD-10-CM | POA: Diagnosis not present

## 2020-03-19 DIAGNOSIS — E782 Mixed hyperlipidemia: Secondary | ICD-10-CM | POA: Diagnosis not present

## 2020-03-19 DIAGNOSIS — G629 Polyneuropathy, unspecified: Secondary | ICD-10-CM | POA: Diagnosis not present

## 2020-03-19 DIAGNOSIS — E042 Nontoxic multinodular goiter: Secondary | ICD-10-CM | POA: Diagnosis not present

## 2020-03-19 DIAGNOSIS — Z1389 Encounter for screening for other disorder: Secondary | ICD-10-CM | POA: Diagnosis not present

## 2020-03-19 DIAGNOSIS — F325 Major depressive disorder, single episode, in full remission: Secondary | ICD-10-CM | POA: Diagnosis not present

## 2020-03-19 DIAGNOSIS — I10 Essential (primary) hypertension: Secondary | ICD-10-CM | POA: Diagnosis not present

## 2020-03-21 ENCOUNTER — Other Ambulatory Visit: Payer: Self-pay

## 2020-03-21 ENCOUNTER — Ambulatory Visit: Payer: Medicare Other | Admitting: Physical Therapy

## 2020-03-21 ENCOUNTER — Encounter: Payer: Self-pay | Admitting: Physical Therapy

## 2020-03-21 DIAGNOSIS — M6281 Muscle weakness (generalized): Secondary | ICD-10-CM | POA: Diagnosis not present

## 2020-03-21 DIAGNOSIS — R2689 Other abnormalities of gait and mobility: Secondary | ICD-10-CM | POA: Diagnosis not present

## 2020-03-21 DIAGNOSIS — R2681 Unsteadiness on feet: Secondary | ICD-10-CM | POA: Diagnosis not present

## 2020-03-21 DIAGNOSIS — R293 Abnormal posture: Secondary | ICD-10-CM

## 2020-03-21 DIAGNOSIS — M79675 Pain in left toe(s): Secondary | ICD-10-CM | POA: Diagnosis not present

## 2020-03-21 NOTE — Therapy (Signed)
[place]. [place], [place], [address] Phone: [phone]   Fax:  [phone]  Physical Therapy Treatment  Patient Details  Name: Debra Hudson MRN: [id number] Date of Birth: Jun 29, 1935 Referring Provider (PT): Laurann Montana   Encounter Date: 03/21/2020   PT End of Session - 03/21/20 1054    Visit Number 4    Date for PT Re-Evaluation 04/25/20    PT Start Time 1013    PT Stop Time 1055    PT Time Calculation (min) 42 min    Activity Tolerance Patient tolerated treatment well    Behavior During Therapy [facility] for tasks assessed/performed           Past Medical History:  Diagnosis Date  . Diabetes mellitus without complication ([place])   . Hypertension     History reviewed. No pertinent surgical history.  There were no vitals filed for this visit.   Subjective Assessment - 03/21/20 1011    Subjective Doing ok but has a sore toe    Currently in Pain? No/denies                             [facility] Adult PT Treatment/Exercise - 03/21/20 0001      High Level Balance   High Level Balance Activities Backward walking    High Level Balance Comments side step over dowel rod      Lumbar Exercises: Aerobic   Nustep L4 x 7 min      Lumbar Exercises: Machines for Strengthening   Cybex Knee Extension 5lb 2x10    Cybex Knee Flexion 15lb 2x10      Lumbar Exercises: Standing   Other Standing Lumbar Exercises Alt 4 in box taps 2x10, Step ups 4in 2x5 each      Lumbar Exercises: Seated   Sit to Stand 5 reps   x3 on airex   Other Seated Lumbar Exercises Standing march on airex   Freq LOB min assit to correct                   PT Short Term Goals - 02/29/20 1503      PT SHORT TERM GOAL #1   Title Pt will be independent with initial HEP    Time 2    Period Weeks    Status New    Target Date 03/14/20      PT SHORT TERM GOAL #2   Title Pt will demo understanding of falls prevention in  the home    Time 2    Period Weeks    Status New    Target Date 03/14/20             PT Long Term Goals - 03/21/20 1056      PT LONG TERM GOAL #1   Title Pt will score at least 75% self confidence for balance on the ABC scale to demonstrate improved confidence with balance during functional mobility.    Status On-going      PT LONG TERM GOAL #2   Title Pt will score at least 50/56 on the berg to demo decreased falls risk    Status On-going                 Plan - 03/21/20 1056    Clinical Impression Statement Pt reports more confidence when she is walking. Some unsteadiness today with step ups and with marches on non compliant  surfaces. No reports of increase pain throughout session. Cues to fully extend LE's with seated extensions.  Decrease step length with RLE noted with backwards walking.    Personal Factors and Comorbidities Age;Fitness    Examination-Activity Limitations Locomotion Level;Reach Overhead;Bend;Caring for Others;Squat;Stairs;Stand;Toileting;Dressing;Transfers    Examination-Participation Restrictions Community Activity;Interpersonal Relationship    Stability/Clinical Decision Making Evolving/Moderate complexity    Rehab Potential Good    PT Frequency 1x / week    PT Treatment/Interventions ADLs/Self Care Home Management;Electrical Stimulation;Moist Heat;DME Instruction;Neuromuscular re-education;Balance training;Therapeutic exercise;Therapeutic activities;Patient/family education;Stair training;Gait training;Functional mobility training;Manual techniques;Energy conservation;Taping;Vestibular;Visual/perceptual remediation/compensation    PT Next Visit Plan gradually progress LE strength and balance exercises, gait training as tolerated. Assess backwards walking safety and speed.           Patient will benefit from skilled therapeutic intervention in order to improve the following deficits and impairments:  Abnormal gait,Decreased coordination,Difficulty  walking,Postural dysfunction,Decreased strength,Decreased mobility,Improper body mechanics,Impaired flexibility,Decreased balance,Pain,Impaired perceived functional ability,Decreased activity tolerance,Decreased endurance  Visit Diagnosis: Abnormal posture  Unsteadiness on feet  Other abnormalities of gait and mobility     Problem List There are no problems to display for this patient.   Scot Jun, PTA 03/21/2020, 11:00 AM  [place]. [place], [place], [address] Phone: [phone]   Fax:  [phone]  Name: Debra Hudson MRN: [id number] Date of Birth: July 26, 1935

## 2020-03-25 ENCOUNTER — Ambulatory Visit: Payer: Medicare Other | Admitting: Physical Therapy

## 2020-03-28 ENCOUNTER — Ambulatory Visit: Payer: Medicare Other | Admitting: Physical Therapy

## 2020-04-01 ENCOUNTER — Other Ambulatory Visit: Payer: Self-pay

## 2020-04-01 ENCOUNTER — Ambulatory Visit: Payer: Medicare Other | Admitting: Physical Therapy

## 2020-04-01 ENCOUNTER — Encounter: Payer: Self-pay | Admitting: Physical Therapy

## 2020-04-01 DIAGNOSIS — R2689 Other abnormalities of gait and mobility: Secondary | ICD-10-CM

## 2020-04-01 DIAGNOSIS — R2681 Unsteadiness on feet: Secondary | ICD-10-CM

## 2020-04-01 DIAGNOSIS — M79675 Pain in left toe(s): Secondary | ICD-10-CM

## 2020-04-01 DIAGNOSIS — R293 Abnormal posture: Secondary | ICD-10-CM

## 2020-04-01 DIAGNOSIS — M6281 Muscle weakness (generalized): Secondary | ICD-10-CM

## 2020-04-01 NOTE — Therapy (Signed)
[place]. [place], [place], [address] Phone: [phone]   Fax:  [phone]  Physical Therapy Treatment  Patient Details  Name: Debra Hudson MRN: [id number] Date of Birth: 13-May-1935 Referring Provider (PT): Laurann Montana   Encounter Date: 04/01/2020   PT End of Session - 04/01/20 1829    Visit Number 5    Number of Visits 9    Date for PT Re-Evaluation 04/25/20    PT Start Time 1430    PT Stop Time 1511    PT Time Calculation (min) 41 min    Activity Tolerance Patient tolerated treatment well    Behavior During Therapy [facility] for tasks assessed/performed           Past Medical History:  Diagnosis Date  . Diabetes mellitus without complication ([place])   . Hypertension     History reviewed. No pertinent surgical history.  There were no vitals filed for this visit.   Subjective Assessment - 04/01/20 1431    Subjective L foot has been really sore recently    Currently in Pain? No/denies                             [facility] Adult PT Treatment/Exercise - 04/01/20 0001      High Level Balance   High Level Balance Activities Backward walking;Tandem walking    High Level Balance Comments side step over dowel rod      Lumbar Exercises: Aerobic   Nustep L4 x 7 min      Lumbar Exercises: Machines for Strengthening   Cybex Knee Extension 5lb 2x112    Cybex Knee Flexion 15lb 2x12      Lumbar Exercises: Seated   Sit to Stand 5 reps                    PT Short Term Goals - 04/01/20 1440      PT SHORT TERM GOAL #1   Title Pt will be independent with initial HEP    Status Achieved      PT SHORT TERM GOAL #2   Title Pt will demo understanding of falls prevention in the home    Status Achieved             PT Long Term Goals - 04/01/20 1452      PT LONG TERM GOAL #1   Title Pt will score at least 75% self confidence for balance on the ABC scale to demonstrate improved  confidence with balance during functional mobility.    Baseline 60%    Status On-going                 Plan - 04/01/20 1512    Clinical Impression Statement time taken to complete ABC confidence questioner, she has increased her confidence score overall. Cues needed with STS for LE placement, anterior wt shift, and to decrease knee valgus. Increase reps tolerated with seated leg curls and extensions. HHA x1 needed with tandem walking    Personal Factors and Comorbidities Age;Fitness    Examination-Activity Limitations Locomotion Level;Reach Overhead;Bend;Caring for Others;Squat;Stairs;Stand;Toileting;Dressing;Transfers    Examination-Participation Restrictions Community Activity;Interpersonal Relationship    Stability/Clinical Decision Making Evolving/Moderate complexity    Rehab Potential Good    PT Frequency 1x / week    PT Duration 8 weeks    PT Treatment/Interventions ADLs/Self Care Home Management;Electrical Stimulation;Moist Heat;DME Instruction;Neuromuscular re-education;Balance training;Therapeutic exercise;Therapeutic activities;Patient/family education;Stair training;Gait training;Functional  mobility training;Manual techniques;Energy conservation;Taping;Vestibular;Visual/perceptual remediation/compensation    PT Next Visit Plan gradually progress LE strength and balance exercises, gait training as tolerated. Assess backwards walking safety and speed.           Patient will benefit from skilled therapeutic intervention in order to improve the following deficits and impairments:  Abnormal gait,Decreased coordination,Difficulty walking,Postural dysfunction,Decreased strength,Decreased mobility,Improper body mechanics,Impaired flexibility,Decreased balance,Pain,Impaired perceived functional ability,Decreased activity tolerance,Decreased endurance  Visit Diagnosis: Abnormal posture  Unsteadiness on feet  Muscle weakness (generalized)  Pain in left toe(s)  Other  abnormalities of gait and mobility     Problem List There are no problems to display for this patient.   Scot Jun, PTA 04/01/2020, 3:20 PM  [place]. [place], [place], [address] Phone: [phone]   Fax:  [phone]  Name: Debra Hudson MRN: [id number] Date of Birth: 1935-04-15

## 2020-04-04 ENCOUNTER — Other Ambulatory Visit: Payer: Self-pay

## 2020-04-04 ENCOUNTER — Ambulatory Visit: Payer: Medicare Other | Attending: Internal Medicine | Admitting: Physical Therapy

## 2020-04-04 ENCOUNTER — Encounter: Payer: Self-pay | Admitting: Physical Therapy

## 2020-04-04 DIAGNOSIS — R2681 Unsteadiness on feet: Secondary | ICD-10-CM

## 2020-04-04 DIAGNOSIS — R293 Abnormal posture: Secondary | ICD-10-CM | POA: Diagnosis not present

## 2020-04-04 DIAGNOSIS — M6281 Muscle weakness (generalized): Secondary | ICD-10-CM | POA: Diagnosis not present

## 2020-04-04 NOTE — Therapy (Signed)
[place]. [place], [place], [address] Phone: [phone]   Fax:  [phone]  Physical Therapy Treatment  Patient Details  Name: SEBASTIANA WUEST MRN: [id number] Date of Birth: 10/10/1935 Referring Provider (PT): Laurann Montana   Encounter Date: 04/04/2020   PT End of Session - 04/04/20 1508    Visit Number 6    Number of Visits 9    Date for PT Re-Evaluation 04/25/20    PT Start Time 1425    PT Stop Time 1508    PT Time Calculation (min) 43 min    Activity Tolerance Patient tolerated treatment well    Behavior During Therapy [facility] for tasks assessed/performed           Past Medical History:  Diagnosis Date  . Diabetes mellitus without complication ([place])   . Hypertension     History reviewed. No pertinent surgical history.  There were no vitals filed for this visit.   Subjective Assessment - 04/04/20 1425    Subjective Pt repots a fall Monday walking into [employer]. Pt reports their was a lip going in the restaurant that she missed. No pain just some discomfort in RUE    Currently in Pain? No/denies                             [facility] Adult PT Treatment/Exercise - 04/04/20 0001      High Level Balance   High Level Balance Comments stepping over half foam rolls with different approaches, side step over half foam rolls      Lumbar Exercises: Aerobic   Nustep L4 x 7 min      Lumbar Exercises: Machines for Strengthening   Cybex Knee Extension 10lb 2x10    Cybex Knee Flexion 20lb 2x10      Lumbar Exercises: Standing   Other Standing Lumbar Exercises Step ups on airex 2x5 each      Lumbar Exercises: Seated   Sit to Stand 10 reps                    PT Short Term Goals - 04/01/20 1440      PT SHORT TERM GOAL #1   Title Pt will be independent with initial HEP    Status Achieved      PT SHORT TERM GOAL #2   Title Pt will demo understanding of falls prevention in the  home    Status Achieved             PT Long Term Goals - 04/04/20 1509      PT LONG TERM GOAL #1   Title Pt will score at least 75% self confidence for balance on the ABC scale to demonstrate improved confidence with balance during functional mobility.                 Plan - 04/04/20 1509    Clinical Impression Statement Pt reports a fall Monday. Part of today's session focused on stepping over objects. She would occasionally clip half foam roll when stepping over. Step up son airex was difficulty. She did well overall with resisted gait but increase instability with resisted side step with resistance on L side. No reports of pain, weakness and fatigue noted with 10 reps of st to stands. Cue to prevent knee valgus with sit to stands.    Personal Factors and Comorbidities Age;Fitness    Examination-Activity Limitations Locomotion  Level;Reach Overhead;Bend;Caring for Others;Squat;Stairs;Stand;Toileting;Dressing;Transfers    Examination-Participation Restrictions Community Activity;Interpersonal Relationship    Stability/Clinical Decision Making Evolving/Moderate complexity    Rehab Potential Good    PT Frequency 1x / week    PT Duration 8 weeks    PT Treatment/Interventions ADLs/Self Care Home Management;Electrical Stimulation;Moist Heat;DME Instruction;Neuromuscular re-education;Balance training;Therapeutic exercise;Therapeutic activities;Patient/family education;Stair training;Gait training;Functional mobility training;Manual techniques;Energy conservation;Taping;Vestibular;Visual/perceptual remediation/compensation    PT Next Visit Plan gradually progress LE strength and balance exercises, gait training as tolerated. Assess backwards walking safety and speed.           Patient will benefit from skilled therapeutic intervention in order to improve the following deficits and impairments:  Abnormal gait,Decreased coordination,Difficulty walking,Postural dysfunction,Decreased  strength,Decreased mobility,Improper body mechanics,Impaired flexibility,Decreased balance,Pain,Impaired perceived functional ability,Decreased activity tolerance,Decreased endurance  Visit Diagnosis: Unsteadiness on feet  Muscle weakness (generalized)  Abnormal posture     Problem List There are no problems to display for this patient.   Scot Jun 04/04/2020, 3:12 PM  [place]. [place], [place], [address] Phone: [phone]   Fax:  [phone]  Name: TACEY DIMAGGIO MRN: [id number] Date of Birth: 1936/01/22

## 2020-04-16 ENCOUNTER — Ambulatory Visit: Payer: Medicare Other | Admitting: Physical Therapy

## 2020-04-18 ENCOUNTER — Encounter: Payer: Medicare Other | Admitting: Physical Therapy

## 2020-04-26 DIAGNOSIS — H401211 Low-tension glaucoma, right eye, mild stage: Secondary | ICD-10-CM | POA: Diagnosis not present

## 2020-04-30 DIAGNOSIS — H409 Unspecified glaucoma: Secondary | ICD-10-CM | POA: Diagnosis not present

## 2020-04-30 DIAGNOSIS — F325 Major depressive disorder, single episode, in full remission: Secondary | ICD-10-CM | POA: Diagnosis not present

## 2020-04-30 DIAGNOSIS — F320 Major depressive disorder, single episode, mild: Secondary | ICD-10-CM | POA: Diagnosis not present

## 2020-04-30 DIAGNOSIS — M1990 Unspecified osteoarthritis, unspecified site: Secondary | ICD-10-CM | POA: Diagnosis not present

## 2020-04-30 DIAGNOSIS — I10 Essential (primary) hypertension: Secondary | ICD-10-CM | POA: Diagnosis not present

## 2020-04-30 DIAGNOSIS — E782 Mixed hyperlipidemia: Secondary | ICD-10-CM | POA: Diagnosis not present

## 2020-04-30 DIAGNOSIS — K219 Gastro-esophageal reflux disease without esophagitis: Secondary | ICD-10-CM | POA: Diagnosis not present

## 2020-04-30 DIAGNOSIS — E1169 Type 2 diabetes mellitus with other specified complication: Secondary | ICD-10-CM | POA: Diagnosis not present

## 2020-04-30 DIAGNOSIS — F329 Major depressive disorder, single episode, unspecified: Secondary | ICD-10-CM | POA: Diagnosis not present

## 2020-05-16 ENCOUNTER — Other Ambulatory Visit: Payer: Self-pay | Admitting: Internal Medicine

## 2020-05-16 ENCOUNTER — Ambulatory Visit
Admission: RE | Admit: 2020-05-16 | Discharge: 2020-05-16 | Disposition: A | Discharge status: Home / Self Care | Payer: Medicare Other | Source: Ambulatory Visit | Attending: Internal Medicine | Admitting: Internal Medicine

## 2020-05-16 DIAGNOSIS — R519 Headache, unspecified: Secondary | ICD-10-CM

## 2020-05-16 DIAGNOSIS — S0083XA Contusion of other part of head, initial encounter: Secondary | ICD-10-CM | POA: Diagnosis not present

## 2020-05-16 DIAGNOSIS — M50322 Other cervical disc degeneration at C5-C6 level: Secondary | ICD-10-CM | POA: Diagnosis not present

## 2020-05-16 DIAGNOSIS — W19XXXA Unspecified fall, initial encounter: Secondary | ICD-10-CM | POA: Diagnosis not present

## 2020-05-16 DIAGNOSIS — Z043 Encounter for examination and observation following other accident: Secondary | ICD-10-CM | POA: Diagnosis not present

## 2020-05-21 DIAGNOSIS — H409 Unspecified glaucoma: Secondary | ICD-10-CM | POA: Diagnosis not present

## 2020-05-21 DIAGNOSIS — S0240CD Maxillary fracture, right side, subsequent encounter for fracture with routine healing: Secondary | ICD-10-CM | POA: Insufficient documentation

## 2020-05-21 DIAGNOSIS — E782 Mixed hyperlipidemia: Secondary | ICD-10-CM | POA: Diagnosis not present

## 2020-05-21 DIAGNOSIS — E1169 Type 2 diabetes mellitus with other specified complication: Secondary | ICD-10-CM | POA: Diagnosis not present

## 2020-05-21 DIAGNOSIS — K219 Gastro-esophageal reflux disease without esophagitis: Secondary | ICD-10-CM | POA: Diagnosis not present

## 2020-05-21 DIAGNOSIS — M1990 Unspecified osteoarthritis, unspecified site: Secondary | ICD-10-CM | POA: Diagnosis not present

## 2020-05-21 DIAGNOSIS — F325 Major depressive disorder, single episode, in full remission: Secondary | ICD-10-CM | POA: Diagnosis not present

## 2020-05-21 DIAGNOSIS — I10 Essential (primary) hypertension: Secondary | ICD-10-CM | POA: Diagnosis not present

## 2020-05-31 DIAGNOSIS — Z1231 Encounter for screening mammogram for malignant neoplasm of breast: Secondary | ICD-10-CM | POA: Diagnosis not present

## 2020-06-26 DIAGNOSIS — Z23 Encounter for immunization: Secondary | ICD-10-CM | POA: Diagnosis not present

## 2020-07-03 DIAGNOSIS — R609 Edema, unspecified: Secondary | ICD-10-CM | POA: Diagnosis not present

## 2020-08-06 DIAGNOSIS — R634 Abnormal weight loss: Secondary | ICD-10-CM | POA: Diagnosis not present

## 2020-08-06 DIAGNOSIS — M79675 Pain in left toe(s): Secondary | ICD-10-CM | POA: Diagnosis not present

## 2020-08-06 DIAGNOSIS — R2681 Unsteadiness on feet: Secondary | ICD-10-CM | POA: Diagnosis not present

## 2020-08-22 ENCOUNTER — Ambulatory Visit (INDEPENDENT_AMBULATORY_CARE_PROVIDER_SITE_OTHER): Payer: Medicare Other | Admitting: Podiatry

## 2020-08-22 ENCOUNTER — Encounter: Payer: Self-pay | Admitting: Podiatry

## 2020-08-22 ENCOUNTER — Ambulatory Visit (INDEPENDENT_AMBULATORY_CARE_PROVIDER_SITE_OTHER): Payer: Medicare Other

## 2020-08-22 ENCOUNTER — Other Ambulatory Visit: Payer: Self-pay

## 2020-08-22 DIAGNOSIS — N952 Postmenopausal atrophic vaginitis: Secondary | ICD-10-CM | POA: Insufficient documentation

## 2020-08-22 DIAGNOSIS — M2042 Other hammer toe(s) (acquired), left foot: Secondary | ICD-10-CM

## 2020-08-22 DIAGNOSIS — R0989 Other specified symptoms and signs involving the circulatory and respiratory systems: Secondary | ICD-10-CM

## 2020-08-22 DIAGNOSIS — R300 Dysuria: Secondary | ICD-10-CM | POA: Insufficient documentation

## 2020-08-22 DIAGNOSIS — F419 Anxiety disorder, unspecified: Secondary | ICD-10-CM | POA: Insufficient documentation

## 2020-08-22 DIAGNOSIS — N393 Stress incontinence (female) (male): Secondary | ICD-10-CM | POA: Insufficient documentation

## 2020-08-22 DIAGNOSIS — R3915 Urgency of urination: Secondary | ICD-10-CM | POA: Insufficient documentation

## 2020-08-24 NOTE — Progress Notes (Signed)
Subjective:  Patient ID: Debra Hudson, female    DOB: 06/04/1935,  MRN: [id number] HPI Chief Complaint  Patient presents with   Toe Pain    2nd toe left - toe overlaps the 3rd toe, previous surgery 10 years ago, hurting   New Patient (Initial Visit)    85 y.o. female presents with the above complaint.   ROS: Denies fever chills nausea vomiting muscle aches pains calf pain back pain chest pain shortness of breath.  Past Medical History:  Diagnosis Date   Diabetes mellitus without complication ([place])    Hypertension    No past surgical history on file.  Current Outpatient Medications:    aspirin EC 81 MG tablet, Take 81 mg by mouth daily., Disp: , Rfl:    cefdinir (OMNICEF) 300 MG capsule, Take 1 capsule (300 mg total) by mouth 2 (two) times daily., Disp: 14 capsule, Rfl: 0   citalopram (CELEXA) 20 MG tablet, Take 20 mg by mouth daily., Disp: , Rfl:    dorzolamide-timolol (COSOPT) 22.3-6.8 MG/ML ophthalmic solution, Place 1 drop into both eyes 2 (two) times daily., Disp: , Rfl: 6   furosemide (LASIX) 40 MG tablet, Take 40 mg by mouth daily., Disp: , Rfl:    gabapentin (NEURONTIN) 100 MG capsule, Take 100 mg by mouth at bedtime., Disp: , Rfl: 9   latanoprost (XALATAN) 0.005 % ophthalmic solution, 1 drop at bedtime., Disp: , Rfl:    LORazepam (ATIVAN) 1 MG tablet, Take 1 mg by mouth 2 (two) times daily as needed., Disp: , Rfl:    losartan-hydrochlorothiazide (HYZAAR) 100-25 MG tablet, Take 1 tablet by mouth daily., Disp: , Rfl: 2   metFORMIN (GLUCOPHAGE-XR) 500 MG 24 hr tablet, Take 500 mg by mouth 2 (two) times daily., Disp: , Rfl:    metoprolol tartrate (LOPRESSOR) 25 MG tablet, Take 25 mg by mouth 2 (two) times daily., Disp: , Rfl: 3   potassium chloride (MICRO-K) 10 MEQ CR capsule, Take 10 mEq by mouth daily., Disp: , Rfl: 3   SIMBRINZA 1-0.2 % SUSP, Apply 1 drop to eye 2 (two) times daily., Disp: , Rfl:    simvastatin (ZOCOR) 40 MG tablet, Take 1 tablet by mouth at bedtime.,  Disp: , Rfl:    traMADol (ULTRAM) 50 MG tablet, Take 50 mg by mouth every 6 (six) hours as needed., Disp: , Rfl:    TRAVATAN Z 0.004 % SOLN ophthalmic solution, Place 1 drop into both eyes at bedtime., Disp: , Rfl: 6   valsartan-hydrochlorothiazide (DIOVAN-HCT) 160-12.5 MG tablet, Take 1 tablet by mouth daily., Disp: , Rfl:   Allergies  Allergen Reactions   Erythromycin Diarrhea   Lisinopril     Other reaction(s): Other (See Comments)   Sertraline Hcl     Other reaction(s): Other (See Comments)   Review of Systems Objective:  There were no vitals filed for this visit.  General: Well developed, nourished, in no acute distress, alert and oriented x3   Dermatological: Skin is warm, dry and supple bilateral. Nails x 10 are well maintained; remaining integument appears unremarkable at this time. There are no open sores, no preulcerative lesions, no rash or signs of infection present.  Vascular: Dorsalis Pedis artery and Posterior Tibial artery pedal pulses are 1/4 bilateral with slightly delayed capillary fill time. Pedal hair growth present. No varicosities and no lower extremity edema present bilateral.   Neruologic: Grossly intact via light touch bilateral. Vibratory intact via tuning fork bilateral. Protective threshold with Semmes Wienstein monofilament intact to  all pedal sites bilateral. Patellar and Achilles deep tendon reflexes 2+ bilateral. No Babinski or clonus noted bilateral.   Musculoskeletal: No gross boney pedal deformities bilateral. No pain, crepitus, or limitation noted with foot and ankle range of motion bilateral. Muscular strength 5/5 in all groups tested bilateral.  Hallux valgus deformity is noted left with third toe under lapping the second toe.  The juxtaposition of the hallux and the second toe at the level of the PIPJ medially is resulting in reactive hyperkeratosis and superficial skin breakdown.  No open lesions or wounds currently.  Gait: Unassisted, Nonantalgic.     Radiographs:  Radiographs taken today demonstrate an osseously mature individual moderate osteopenia no acute findings.  Chronic hallux valgus previous correction left with hammertoe fusion and under lapping of toes 3 4 and 5.  Third toe underlapped the second.   Assessment & Plan:   Assessment: Painful toe deformity second left.  Plan: Discussed with both she and her daughter today about surgical intervention.  We would like to receive ABIs prior to any surgery on this foot.  She understands and is amendable to it.  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  We did discuss the possible postop complications which may include but are not limited to postop pain bleeding swelling infection recurrence need for further surgery overcorrection under correction loss of digit loss of limb loss of life continue angulation of the hallux and the lesser digits.  She understands this and is amendable to it.  Provide her with information regarding the surgery center anesthesia.  Once we hear back from the vascular department regarding her circulation we will go ahead and proceed to surgical scheduling.     Shanley Furlough T. [place], [place]

## 2020-08-28 ENCOUNTER — Encounter (HOSPITAL_COMMUNITY): Payer: Medicare Other

## 2020-09-04 ENCOUNTER — Other Ambulatory Visit: Payer: Self-pay

## 2020-09-04 ENCOUNTER — Ambulatory Visit (HOSPITAL_COMMUNITY)
Admission: RE | Admit: 2020-09-04 | Discharge: 2020-09-04 | Disposition: A | Discharge status: Home / Self Care | Payer: Medicare Other | Source: Ambulatory Visit | Attending: Podiatry | Admitting: Podiatry

## 2020-09-04 DIAGNOSIS — R0989 Other specified symptoms and signs involving the circulatory and respiratory systems: Secondary | ICD-10-CM | POA: Insufficient documentation

## 2020-09-17 DIAGNOSIS — Z7984 Long term (current) use of oral hypoglycemic drugs: Secondary | ICD-10-CM | POA: Diagnosis not present

## 2020-09-17 DIAGNOSIS — F4321 Adjustment disorder with depressed mood: Secondary | ICD-10-CM | POA: Diagnosis not present

## 2020-09-17 DIAGNOSIS — E1169 Type 2 diabetes mellitus with other specified complication: Secondary | ICD-10-CM | POA: Diagnosis not present

## 2020-09-17 DIAGNOSIS — R634 Abnormal weight loss: Secondary | ICD-10-CM | POA: Diagnosis not present

## 2020-09-17 DIAGNOSIS — I10 Essential (primary) hypertension: Secondary | ICD-10-CM | POA: Diagnosis not present

## 2020-09-25 ENCOUNTER — Emergency Department (HOSPITAL_BASED_OUTPATIENT_CLINIC_OR_DEPARTMENT_OTHER): Payer: Medicare Other

## 2020-09-25 ENCOUNTER — Emergency Department (HOSPITAL_BASED_OUTPATIENT_CLINIC_OR_DEPARTMENT_OTHER)
Admission: EM | Admit: 2020-09-25 | Discharge: 2020-09-25 | Disposition: A | Discharge status: Home / Self Care | Payer: Medicare Other | Attending: Emergency Medicine | Admitting: Emergency Medicine

## 2020-09-25 ENCOUNTER — Other Ambulatory Visit: Payer: Self-pay | Admitting: Podiatry

## 2020-09-25 ENCOUNTER — Other Ambulatory Visit: Payer: Self-pay

## 2020-09-25 ENCOUNTER — Encounter (HOSPITAL_BASED_OUTPATIENT_CLINIC_OR_DEPARTMENT_OTHER): Payer: Self-pay | Admitting: Pharmacy Technician

## 2020-09-25 DIAGNOSIS — Z79899 Other long term (current) drug therapy: Secondary | ICD-10-CM | POA: Diagnosis not present

## 2020-09-25 DIAGNOSIS — Z7982 Long term (current) use of aspirin: Secondary | ICD-10-CM | POA: Insufficient documentation

## 2020-09-25 DIAGNOSIS — S0181XA Laceration without foreign body of other part of head, initial encounter: Secondary | ICD-10-CM | POA: Insufficient documentation

## 2020-09-25 DIAGNOSIS — I10 Essential (primary) hypertension: Secondary | ICD-10-CM | POA: Diagnosis not present

## 2020-09-25 DIAGNOSIS — W19XXXA Unspecified fall, initial encounter: Secondary | ICD-10-CM

## 2020-09-25 DIAGNOSIS — M7989 Other specified soft tissue disorders: Secondary | ICD-10-CM | POA: Diagnosis not present

## 2020-09-25 DIAGNOSIS — Z23 Encounter for immunization: Secondary | ICD-10-CM | POA: Diagnosis not present

## 2020-09-25 DIAGNOSIS — S022XXA Fracture of nasal bones, initial encounter for closed fracture: Secondary | ICD-10-CM

## 2020-09-25 DIAGNOSIS — Y92512 Supermarket, store or market as the place of occurrence of the external cause: Secondary | ICD-10-CM | POA: Diagnosis not present

## 2020-09-25 DIAGNOSIS — E119 Type 2 diabetes mellitus without complications: Secondary | ICD-10-CM | POA: Insufficient documentation

## 2020-09-25 DIAGNOSIS — M25561 Pain in right knee: Secondary | ICD-10-CM | POA: Insufficient documentation

## 2020-09-25 DIAGNOSIS — S0993XA Unspecified injury of face, initial encounter: Secondary | ICD-10-CM | POA: Diagnosis present

## 2020-09-25 DIAGNOSIS — S199XXA Unspecified injury of neck, initial encounter: Secondary | ICD-10-CM | POA: Diagnosis not present

## 2020-09-25 DIAGNOSIS — S0101XA Laceration without foreign body of scalp, initial encounter: Secondary | ICD-10-CM | POA: Diagnosis not present

## 2020-09-25 DIAGNOSIS — R58 Hemorrhage, not elsewhere classified: Secondary | ICD-10-CM | POA: Diagnosis not present

## 2020-09-25 DIAGNOSIS — S01511A Laceration without foreign body of lip, initial encounter: Secondary | ICD-10-CM

## 2020-09-25 DIAGNOSIS — W010XXA Fall on same level from slipping, tripping and stumbling without subsequent striking against object, initial encounter: Secondary | ICD-10-CM | POA: Diagnosis not present

## 2020-09-25 DIAGNOSIS — S0121XA Laceration without foreign body of nose, initial encounter: Secondary | ICD-10-CM | POA: Diagnosis not present

## 2020-09-25 DIAGNOSIS — M47812 Spondylosis without myelopathy or radiculopathy, cervical region: Secondary | ICD-10-CM | POA: Diagnosis not present

## 2020-09-25 MED ORDER — TETANUS-DIPHTH-ACELL PERTUSSIS 5-2.5-18.5 LF-MCG/0.5 IM SUSY
0.5000 mL | PREFILLED_SYRINGE | Freq: Once | INTRAMUSCULAR | Status: AC
  Administered 2020-09-25: 0.5 mL via INTRAMUSCULAR
  Filled 2020-09-25: qty 0.5

## 2020-09-25 MED ORDER — ONDANSETRON HCL 4 MG PO TABS: 4.0000 mg | ORAL_TABLET | Freq: Three times a day (TID) | ORAL | 0 refills | Status: DC | PRN

## 2020-09-25 MED ORDER — CEPHALEXIN 500 MG PO CAPS: 500.0000 mg | ORAL_CAPSULE | Freq: Three times a day (TID) | ORAL | 0 refills | Status: DC

## 2020-09-25 MED ORDER — LIDOCAINE-EPINEPHRINE-TETRACAINE (LET) TOPICAL GEL
3.0000 mL | Freq: Once | TOPICAL | Status: DC
  Filled 2020-09-25: qty 3

## 2020-09-25 MED ORDER — ACETAMINOPHEN 325 MG PO TABS
650.0000 mg | ORAL_TABLET | Freq: Once | ORAL | Status: AC
  Administered 2020-09-25: 650 mg via ORAL
  Filled 2020-09-25: qty 2

## 2020-09-25 MED ORDER — HYDROCODONE-ACETAMINOPHEN 10-325 MG PO TABS: 1.0000 | ORAL_TABLET | Freq: Four times a day (QID) | ORAL | 0 refills | Status: AC | PRN

## 2020-09-25 MED ORDER — LIDOCAINE-EPINEPHRINE (PF) 2 %-1:200000 IJ SOLN
10.0000 mL | Freq: Once | INTRAMUSCULAR | Status: AC
  Administered 2020-09-25: 10 mL
  Filled 2020-09-25: qty 20

## 2020-09-25 NOTE — ED Provider Notes (Signed)
[facility] EMERGENCY DEPARTMENT Provider Note   CSN: [id number] Arrival date & time: 09/25/20  1619     History Chief Complaint  Patient presents with   Debra Hudson is a 85 y.o. female.  Past medical history of anxiety.  Patient presents after a minute mechanical fall while at [facility].  She tripped over a strip outlet and fell on her face.  She denies any loss of consciousness or confusion afterwards. She denies any weakness, vision changes, numbness or tingling. She complains of right knee pain. She is able to walk. She has full range of motion. She has three lacerations. One on left forehead above eyebrow. One on nasal bridge. One on upper lip.  She complains of headache and facial pain. She is not on blood thinners.    Fall Associated symptoms include headaches. Pertinent negatives include no chest pain and no shortness of breath.      Past Medical History:  Diagnosis Date   Diabetes mellitus without complication ([place])    Hypertension     Patient Active Problem List   Diagnosis Date Noted   Urinary urgency 08/22/2020   Anxiety 08/22/2020   Atrophic vaginitis 08/22/2020   Dysuria 08/22/2020   Female stress incontinence 08/22/2020   Closed fracture of right side of maxilla with routine healing 05/21/2020    History reviewed. No pertinent surgical history.   OB History   No obstetric history on file.     No family history on file.  Social History   Tobacco Use   Smoking status: Never   Smokeless tobacco: Never    Home Medications Prior to Admission medications   Medication Sig Start Date End Date Taking? Authorizing Provider  aspirin EC 81 MG tablet Take 81 mg by mouth daily.    [provider]  cefdinir (OMNICEF) 300 MG capsule Take 1 capsule (300 mg total) by mouth 2 (two) times daily. 05/29/15   Quintella Reichert, MD  cephALEXin (KEFLEX) 500 MG capsule Take 1 capsule (500 mg total) by mouth 3 (three) times daily. 09/25/20    Hyatt, Max T, DPM  citalopram (CELEXA) 20 MG tablet Take 20 mg by mouth daily. 08/19/20   [provider]  dorzolamide-timolol (COSOPT) 22.3-6.8 MG/ML ophthalmic solution Place 1 drop into both eyes 2 (two) times daily. 05/21/15   [provider]  furosemide (LASIX) 40 MG tablet Take 40 mg by mouth daily. 08/07/20   [provider]  gabapentin (NEURONTIN) 100 MG capsule Take 100 mg by mouth at bedtime. 04/09/15   [provider]  HYDROcodone-acetaminophen (NORCO) 10-325 MG tablet Take 1 tablet by mouth every 6 (six) hours as needed for up to 7 days. 09/25/20 10/02/20  Hyatt, Max T, DPM  latanoprost (XALATAN) 0.005 % ophthalmic solution 1 drop at bedtime. 07/04/20   [provider]  LORazepam (ATIVAN) 1 MG tablet Take 1 mg by mouth 2 (two) times daily as needed. 07/11/20   [provider]  losartan-hydrochlorothiazide (HYZAAR) 100-25 MG tablet Take 1 tablet by mouth daily. 05/01/15   [provider]  metFORMIN (GLUCOPHAGE-XR) 500 MG 24 hr tablet Take 500 mg by mouth 2 (two) times daily. 08/02/20   [provider]  metoprolol tartrate (LOPRESSOR) 25 MG tablet Take 25 mg by mouth 2 (two) times daily. 04/15/15   [provider]  ondansetron (ZOFRAN) 4 MG tablet Take 1 tablet (4 mg total) by mouth every 8 (eight) hours as needed. 09/25/20   Milinda Pointer, Max  T, DPM  potassium chloride (MICRO-K) 10 MEQ CR capsule Take 10 mEq by mouth daily. 03/12/15   [provider]  SIMBRINZA 1-0.2 % SUSP Apply 1 drop to eye 2 (two) times daily. 04/11/20   [provider]  simvastatin (ZOCOR) 40 MG tablet Take 1 tablet by mouth at bedtime.    [provider]  traMADol (ULTRAM) 50 MG tablet Take 50 mg by mouth every 6 (six) hours as needed. 05/27/20   [provider]  TRAVATAN Z 0.004 % SOLN ophthalmic solution Place 1 drop into both eyes at bedtime. 03/22/15   [provider]  valsartan-hydrochlorothiazide (DIOVAN-HCT)  160-12.5 MG tablet Take 1 tablet by mouth daily. 06/19/20   [provider]    Allergies    Erythromycin, Lisinopril, and Sertraline hcl  Review of Systems   Review of Systems  HENT:  Positive for facial swelling. Negative for congestion, sinus pressure, sinus pain and trouble swallowing.   Eyes:  Negative for pain, redness and visual disturbance.  Respiratory:  Negative for shortness of breath.   Cardiovascular:  Negative for chest pain.  Gastrointestinal:  Negative for nausea and vomiting.  Musculoskeletal:  Positive for arthralgias. Negative for back pain, gait problem, joint swelling, neck pain and neck stiffness.  Skin:  Positive for wound.  Neurological:  Positive for headaches. Negative for facial asymmetry, speech difficulty, weakness and numbness.  All other systems reviewed and are negative.  Physical Exam Updated Vital Signs BP 131/69   Pulse (!) 55   Temp 98.2 F (36.8 C) (Oral)   Resp 16   SpO2 100%   Physical Exam Vitals and nursing note reviewed.  Constitutional:      General: She is not in acute distress.    Appearance: Normal appearance. She is not ill-appearing, toxic-appearing or diaphoretic.  HENT:     Head: Normocephalic.     Comments: 1 cm superficial Laceration to left supraorbital forehead. Small Laceration to nasal bridge. Skin is excoriated and oozing. Laceration to upper lip oozing. Swelling to left lower lip.     Nose:     Comments: Bruising and Swelling of nasal bridge, no evidence of septal hematoma Eyes:     General: No scleral icterus.       Right eye: No discharge.        Left eye: No discharge.     Conjunctiva/sclera: Conjunctivae normal.  Pulmonary:     Effort: Pulmonary effort is normal. No respiratory distress.  Musculoskeletal:        General: Tenderness present. No swelling, deformity or signs of injury.     Cervical back: Normal range of motion and neck supple. No tenderness.     Right lower leg: No edema.     Left lower  leg: No edema.     Comments: Bony Tenderness to right knee. ROM fully intact. Gait normal with no limp.   Skin:    General: Skin is warm and dry.     Findings: Bruising and lesion present.  Neurological:     General: No focal deficit present.     Mental Status: She is alert and oriented to person, place, and time.     Cranial Nerves: No cranial nerve deficit.     Sensory: No sensory deficit.     Motor: No weakness.     Coordination: Coordination normal.     Gait: Gait normal.  Psychiatric:        Mood and Affect: Mood normal.  Behavior: Behavior normal.    ED Results / Procedures / Treatments   Labs (all labs ordered are listed, but only abnormal results are displayed) Labs Reviewed - No data to display  EKG None  Radiology CT HEAD WO CONTRAST (5MM)  Result Date: 09/25/2020 CLINICAL DATA:  [place], head trauma EXAM: CT HEAD WITHOUT CONTRAST CT MAXILLOFACIAL WITHOUT CONTRAST CT CERVICAL SPINE WITHOUT CONTRAST TECHNIQUE: Multidetector CT imaging of the head, cervical spine, and maxillofacial structures were performed using the standard protocol without intravenous contrast. Multiplanar CT image reconstructions of the cervical spine and maxillofacial structures were also generated. COMPARISON:  05/16/2020 FINDINGS: CT HEAD FINDINGS Brain: No acute infarct or hemorrhage. Lateral ventricles and midline structures are unremarkable. No acute extra-axial fluid collections. No mass effect. Vascular: No hyperdense vessel or unexpected calcification. Skull: Normal. Negative for fracture or focal lesion. Other: None. CT MAXILLOFACIAL FINDINGS Osseous: There are minimally displaced fractures involving bilateral nasal bones, with overlying soft tissue swelling. No other acute displaced facial bone fractures. Orbits: Negative. No traumatic or inflammatory finding. Sinuses: Clear. Soft tissues: Soft tissue swelling overlying the nasal bridge. Remaining soft tissues are unremarkable. CT CERVICAL SPINE  FINDINGS Alignment: There is slight reversal of cervical lordosis due to multilevel spondylosis and facet hypertrophy. Otherwise alignment is anatomic. Skull base and vertebrae: No acute fracture. No primary bone lesion or focal pathologic process. Soft tissues and spinal canal: No prevertebral fluid or swelling. No visible canal hematoma. Diffuse nodularity of the thyroid is again noted, a known finding that has been previously evaluated by ultrasound and nuclear medicine studies. Disc levels: There is mild diffuse spondylosis, most pronounced at C6-7. Extensive multilevel facet hypertrophy is identified, right predominant from C2 through C6. Upper chest: Airway is patent.  Lung apices are clear. Other: Reconstructed images demonstrate no additional findings. IMPRESSION: 1. No acute intracranial process. 2. Minimally displaced fractures of the bilateral nasal bones, with overlying soft tissue swelling. 3. No acute cervical spine fracture. Extensive multilevel spondylosis and facet hypertrophy. Electronically Signed   By: Randa Ngo M.D.   On: 09/25/2020 17:40   CT Cervical Spine Wo Contrast  Result Date: 09/25/2020 CLINICAL DATA:  [place], head trauma EXAM: CT HEAD WITHOUT CONTRAST CT MAXILLOFACIAL WITHOUT CONTRAST CT CERVICAL SPINE WITHOUT CONTRAST TECHNIQUE: Multidetector CT imaging of the head, cervical spine, and maxillofacial structures were performed using the standard protocol without intravenous contrast. Multiplanar CT image reconstructions of the cervical spine and maxillofacial structures were also generated. COMPARISON:  05/16/2020 FINDINGS: CT HEAD FINDINGS Brain: No acute infarct or hemorrhage. Lateral ventricles and midline structures are unremarkable. No acute extra-axial fluid collections. No mass effect. Vascular: No hyperdense vessel or unexpected calcification. Skull: Normal. Negative for fracture or focal lesion. Other: None. CT MAXILLOFACIAL FINDINGS Osseous: There are minimally displaced  fractures involving bilateral nasal bones, with overlying soft tissue swelling. No other acute displaced facial bone fractures. Orbits: Negative. No traumatic or inflammatory finding. Sinuses: Clear. Soft tissues: Soft tissue swelling overlying the nasal bridge. Remaining soft tissues are unremarkable. CT CERVICAL SPINE FINDINGS Alignment: There is slight reversal of cervical lordosis due to multilevel spondylosis and facet hypertrophy. Otherwise alignment is anatomic. Skull base and vertebrae: No acute fracture. No primary bone lesion or focal pathologic process. Soft tissues and spinal canal: No prevertebral fluid or swelling. No visible canal hematoma. Diffuse nodularity of the thyroid is again noted, a known finding that has been previously evaluated by ultrasound and nuclear medicine studies. Disc levels: There is mild diffuse spondylosis, most pronounced at  C6-7. Extensive multilevel facet hypertrophy is identified, right predominant from C2 through C6. Upper chest: Airway is patent.  Lung apices are clear. Other: Reconstructed images demonstrate no additional findings. IMPRESSION: 1. No acute intracranial process. 2. Minimally displaced fractures of the bilateral nasal bones, with overlying soft tissue swelling. 3. No acute cervical spine fracture. Extensive multilevel spondylosis and facet hypertrophy. Electronically Signed   By: Randa Ngo M.D.   On: 09/25/2020 17:40   CT Maxillofacial Wo Contrast  Result Date: 09/25/2020 CLINICAL DATA:  [place], head trauma EXAM: CT HEAD WITHOUT CONTRAST CT MAXILLOFACIAL WITHOUT CONTRAST CT CERVICAL SPINE WITHOUT CONTRAST TECHNIQUE: Multidetector CT imaging of the head, cervical spine, and maxillofacial structures were performed using the standard protocol without intravenous contrast. Multiplanar CT image reconstructions of the cervical spine and maxillofacial structures were also generated. COMPARISON:  05/16/2020 FINDINGS: CT HEAD FINDINGS Brain: No acute infarct  or hemorrhage. Lateral ventricles and midline structures are unremarkable. No acute extra-axial fluid collections. No mass effect. Vascular: No hyperdense vessel or unexpected calcification. Skull: Normal. Negative for fracture or focal lesion. Other: None. CT MAXILLOFACIAL FINDINGS Osseous: There are minimally displaced fractures involving bilateral nasal bones, with overlying soft tissue swelling. No other acute displaced facial bone fractures. Orbits: Negative. No traumatic or inflammatory finding. Sinuses: Clear. Soft tissues: Soft tissue swelling overlying the nasal bridge. Remaining soft tissues are unremarkable. CT CERVICAL SPINE FINDINGS Alignment: There is slight reversal of cervical lordosis due to multilevel spondylosis and facet hypertrophy. Otherwise alignment is anatomic. Skull base and vertebrae: No acute fracture. No primary bone lesion or focal pathologic process. Soft tissues and spinal canal: No prevertebral fluid or swelling. No visible canal hematoma. Diffuse nodularity of the thyroid is again noted, a known finding that has been previously evaluated by ultrasound and nuclear medicine studies. Disc levels: There is mild diffuse spondylosis, most pronounced at C6-7. Extensive multilevel facet hypertrophy is identified, right predominant from C2 through C6. Upper chest: Airway is patent.  Lung apices are clear. Other: Reconstructed images demonstrate no additional findings. IMPRESSION: 1. No acute intracranial process. 2. Minimally displaced fractures of the bilateral nasal bones, with overlying soft tissue swelling. 3. No acute cervical spine fracture. Extensive multilevel spondylosis and facet hypertrophy. Electronically Signed   By: Randa Ngo M.D.   On: 09/25/2020 17:40    Procedures Procedures   Medications Ordered in ED Medications  lidocaine-EPINEPHrine-tetracaine (LET) topical gel (0 mLs Topical Hold 09/25/20 1851)  lidocaine-EPINEPHrine (XYLOCAINE W/EPI) 2 %-1:200000 (PF)  injection 10 mL (10 mLs Infiltration Given by Other 09/25/20 1815)  Tdap (BOOSTRIX) injection 0.5 mL (0.5 mLs Intramuscular Given 09/25/20 1816)  acetaminophen (TYLENOL) tablet 650 mg (650 mg Oral Given 09/25/20 1852)    ED Course  I have reviewed the triage vital signs and the nursing notes.  Pertinent labs & imaging results that were available during my care of the patient were reviewed by me and considered in my medical decision making (see chart for details).  Clinical Course as of 09/25/20 1925  Wed Sep 25, 2020  1754 CT face with minimally displaced fractures involving bilateral nasal bones, with overlying soft tissue swelling. No other acute displaced facial bone fractures.   [GL]  [id number] CT cervical spine with no acute findings [GL]  1754 CT head with no acute findings.  [GL]  1900 Laceration repair to upper lip with 4-0 Vicryl. One stitch Skin glue to nasal bridge Skin glue to forehead lac Tolerated well  [GL]    Clinical Course User Index [  GL] Keziah Drotar, Adora Fridge, PA-C   MDM Rules/Calculators/A&P                          This is a well appearing 85 y.o. female who presents after a mechanical fall and landed on her face. She did not lose consciousness. She did have a few seconds of amnesia where she did not remember that she had diabetes. She returned to baseline shortly after. She does not have any neurological symptoms in ED. She is not anticoagulated. CT head obtained to rule out bleed. She has facial swelling to nasal bridge. CT face ordered. Also ordered CT cervical spine to rule out any fractures.   CT face with minimally displaced fractures involving bilateral nasal bones, with overlying soft tissue swelling. No other acute displaced facial bone fractures. No evidence of septal hematoma on CT scan or physical exam. CT head and neck with no acute findings.   She has multiple facial lacerations. All sites cleansed and explored. Forehead laceration closed with dermabond.  Nasal Laceration closed with dermabond. Upper lip laceration with one 6-0 Vicryl stitch and dermabond. All sites with no evidence of bleeding.   Patient is stable for discharge. I discussed results, treatment plan, need for follow-up, and return precautions with the patient and daughter at bedside. Provided opportunity for questions, patient and parent confirmed understanding and are in agreement with plan.    Final Clinical Impression(s) / ED Diagnoses Final diagnoses:  Fall, initial encounter  Laceration of scalp, initial encounter  Lip laceration, initial encounter  Laceration of nose, initial encounter  Closed fracture of nasal bone, initial encounter    Rx / DC Orders ED Discharge Orders     None        Adolphus Birchwood, PA-C 09/25/20 1927    Blanchie Dessert, MD 09/26/20 0004

## 2020-09-25 NOTE — ED Triage Notes (Signed)
Pt bib ems with reports of mechanical fall. Pt with lac to forehead, nose and lip. Pt also complains of R knee pain. No deformity noted. CNS intact. Pt denies LOC, denies blood thinners. VSS with ems.

## 2020-09-25 NOTE — Progress Notes (Unsigned)
nor

## 2020-09-25 NOTE — ED Notes (Signed)
Patient Alert and oriented to baseline. Stable and ambulatory to baseline. Patient verbalized understanding of the discharge instructions.  Patient belongings were taken by the patient.   

## 2020-09-25 NOTE — Discharge Instructions (Addendum)
For your laceration. Your sutures in your upper lip should dissolve on its own within two weeks. If you continue to bleed from sites, hold pressure. If you cannot get bleeding to stop, please return.

## 2020-09-26 ENCOUNTER — Telehealth: Payer: Self-pay

## 2020-09-26 NOTE — Telephone Encounter (Signed)
Surgery with Dr. Milinda Pointer on 09/27/2020 was canceled by Joycelyn Schmid due to a fall she had on 09/25/2020. I left a message for Kynadee to call me so we could get her surgery rescheduled.

## 2020-09-26 NOTE — Telephone Encounter (Addendum)
Spoke to Faelynn's daughter, Jeneen Montgomery. We have her surgery rescheduled to 11/29/2020. I informed her that the medications was called in 09/25/2020 and she needed to hold on to it till 11/29/2020 and that Dr. Milinda Pointer will not call in anymore pain medicine. Deena stated she understood and she would inform her mother. She also stated that they did pick up the medication yesterday except the Zofran. She stated the cost was $80.00 and her mother didn't want to pay that much. Is there a cheaper Rx or anything over the counter she can take? I told her I will get with Dr. Milinda Pointer and let her know.

## 2020-10-01 ENCOUNTER — Other Ambulatory Visit: Payer: Self-pay | Admitting: Internal Medicine

## 2020-10-01 ENCOUNTER — Ambulatory Visit
Admission: RE | Admit: 2020-10-01 | Discharge: 2020-10-01 | Disposition: A | Discharge status: Home / Self Care | Payer: Medicare Other | Source: Ambulatory Visit | Attending: Internal Medicine | Admitting: Internal Medicine

## 2020-10-01 ENCOUNTER — Other Ambulatory Visit: Payer: Self-pay

## 2020-10-01 DIAGNOSIS — R519 Headache, unspecified: Secondary | ICD-10-CM

## 2020-10-01 DIAGNOSIS — G44311 Acute post-traumatic headache, intractable: Secondary | ICD-10-CM | POA: Diagnosis not present

## 2020-10-02 ENCOUNTER — Ambulatory Visit: Payer: Medicare Other

## 2020-10-02 ENCOUNTER — Other Ambulatory Visit: Payer: Self-pay | Admitting: Internal Medicine

## 2020-10-02 DIAGNOSIS — G44311 Acute post-traumatic headache, intractable: Secondary | ICD-10-CM

## 2020-10-03 ENCOUNTER — Encounter: Payer: Medicare Other | Admitting: Podiatry

## 2020-10-09 DIAGNOSIS — H43812 Vitreous degeneration, left eye: Secondary | ICD-10-CM | POA: Diagnosis not present

## 2020-10-10 ENCOUNTER — Encounter: Payer: Medicare Other | Admitting: Podiatry

## 2020-10-10 ENCOUNTER — Ambulatory Visit: Payer: Medicare Other | Attending: Internal Medicine

## 2020-10-10 ENCOUNTER — Other Ambulatory Visit: Payer: Self-pay

## 2020-10-10 DIAGNOSIS — M6281 Muscle weakness (generalized): Secondary | ICD-10-CM | POA: Diagnosis not present

## 2020-10-10 DIAGNOSIS — R2689 Other abnormalities of gait and mobility: Secondary | ICD-10-CM | POA: Insufficient documentation

## 2020-10-10 DIAGNOSIS — R293 Abnormal posture: Secondary | ICD-10-CM | POA: Diagnosis not present

## 2020-10-10 DIAGNOSIS — R2681 Unsteadiness on feet: Secondary | ICD-10-CM | POA: Diagnosis not present

## 2020-10-10 NOTE — Patient Instructions (Signed)
Access Code: [email or web address] URL: [email or web address] Date: 10/10/2020 Prepared by: Kathreen Cornfield  Exercises Seated Hip Abduction with Resistance - 1 x daily - 7 x weekly - 2 sets - 10 reps Standing Heel Raise with Support - 1 x daily - 7 x weekly - 2 sets - 10 reps Standing Tandem Balance with Counter Support - 1 x daily - 7 x weekly - 3 sets - 20 seconds hold Narrow Stance with Counter Support - 1 x daily - 7 x weekly - 3 sets - 20 seconds hold

## 2020-10-10 NOTE — Therapy (Signed)
[place]. [place], [place], [address] Phone: [phone]   Fax:  [phone]  Physical Therapy Evaluation  Patient Details  Name: Debra Hudson MRN: [id number] Date of Birth: 12-13-1935 Referring Provider (PT): Lavone Orn, MD   Encounter Date: 10/10/2020   PT End of Session - 10/10/20 1653     Visit Number 1    Number of Visits 10    Date for PT Re-Evaluation 12/19/20    Authorization Type Medicare    Progress Note Due on Visit 10    PT Start Time 1315    PT Stop Time 1400    PT Time Calculation (min) 45 min    Equipment Utilized During Treatment --   small quad based cane   Activity Tolerance Patient tolerated treatment well    Behavior During Therapy WFL for tasks assessed/performed             Past Medical History:  Diagnosis Date   Diabetes mellitus without complication ([place])    Hypertension     History reviewed. No pertinent surgical history.  There were no vitals filed for this visit.    Subjective Assessment - 10/10/20 1323     Subjective Pt reports she had a couple of falls sometime earlier this year before this recent one. She had her quad cane to go run errands with daughter, and coming out of the drug store, her foot caught a raised part of the carpet and fell. She broke her nose and went [facility] a [facility] for [facility] scan then saw Dr. Laurann Montana for another CT scan and both were negative. She had a terrible headache that is getting better, but is a little today.    Pertinent History 11/29/20 will have L toe amputation planned    Diagnostic tests Cervical Spine XR: 1. No acute intracranial process.  2. Minimally displaced fractures of the bilateral nasal bones, with  overlying soft tissue swelling.  3. No acute cervical spine fracture. Extensive multilevel  spondylosis and facet hypertrophy; CT Head: negative for intracranial abnormality    Currently in Pain? Yes    Pain Score 4     Pain  Location Head    Pain Orientation Anterior    Pain Descriptors / Indicators Dull    Pain Type Acute pain    Pain Onset 1 to 4 weeks ago    Pain Frequency Intermittent                [facility] PT Assessment - 10/10/20 0001       Assessment   Medical Diagnosis Abnormality of Gait    Referring Provider (PT) Lavone Orn, MD    Onset Date/Surgical Date 09/25/20    Hand Dominance Right    Next MD Visit Thinks next Feb    Prior Therapy January 2022 for gait disturbance      Precautions   Precautions None      Restrictions   Weight Bearing Restrictions No      Balance Screen   Has the patient fallen in the past 6 months Yes    How many times? at least 1x    Has the patient had a decrease in activity level because of a fear of falling?  Yes    Is the patient reluctant to leave their home because of a fear of falling?  No      Home Social worker Private residence    Living Arrangements Alone  spouse passed away in June   Available Help at Discharge Family   2 daughters   Type of [place] Access Level entry    Home Layout Two level;Able to live on main level with bedroom/bathroom    Home Equipment Bedside commode;Wheelchair - manual;Cane - quad;Walker - 2 wheels;Walker - 4 wheels      Prior Function   Level of Independence Independent with basic ADLs;Independent with household mobility with device;Independent with community mobility with device;Independent with homemaking with ambulation   has a Optometrist Retired    Geologist, engineering, some reading, plans to walk to [employer] Comments Mild increased thoracic kyphosis and mild forward head      Ambulation/Gait   Ambulation/Gait Yes    Ambulation/Gait Assistance 6: Modified independent (Device/Increase time)    Ambulation Distance (Feet) 75 Feet    Assistive device Small based quad cane    Gait Pattern --   decreased  lateral sway, reciprocal arm swing, ankle dorsiflexion, shortened step lengths, mild knee flexion maintained at initial contact     Standardized Balance Assessment   Standardized Balance Assessment Berg Balance Test;Five Times Sit to Stand;Timed Up and Go Test    Five times sit to stand comments  17.34s   valgus during eccentric     Berg Balance Test   Sit to Stand Able to stand  independently using hands    Standing Unsupported Able to stand safely 2 minutes    Sitting with Back Unsupported but Feet Supported on Floor or Stool Able to sit safely and securely 2 minutes    Stand to Sit Sits safely with minimal use of hands    Transfers Able to transfer safely, minor use of hands    Standing Unsupported with Eyes Closed Able to stand 10 seconds with supervision    Standing Unsupported with Feet Together Able to place feet together independently but unable to hold for 30 seconds    From Standing, Reach Forward with Outstretched Arm Can reach forward >12 cm safely (5")    From Standing Position, Pick up Object from Floor Able to pick up shoe safely and easily    From Standing Position, Turn to Look Behind Over each Shoulder Looks behind one side only/other side shows less weight shift    Turn 360 Degrees Able to turn 360 degrees safely but slowly    Standing Unsupported, Alternately Place Feet on Step/Stool Able to complete >2 steps/needs minimal assist    Standing Unsupported, One Foot in [employer] balance while stepping or standing    Standing on One Leg Tries to lift leg/unable to hold 3 seconds but remains standing independently    Total Score 38      Timed Up and Go Test   Normal TUG (seconds) 14.25   modified hurry cane with quad base                       Objective measurements completed on examination: See above findings.                PT Education - 10/10/20 1651     Education Details Diagnosis, prognosis, POC, HEP    Person(s) Educated Patient     Methods Explanation;Demonstration;Tactile cues;Handout;Verbal cues    Comprehension Verbalized understanding;Returned demonstration;Verbal cues required;Tactile cues required  PT Short Term Goals - 10/10/20 1658       PT SHORT TERM GOAL #1   Title Pt will be independent with initial HEP    Time 3    Period Weeks    Status New    Target Date 10/31/20      PT SHORT TERM GOAL #2   Title Pt will demo understanding of falls prevention in the home, i.e. nonslip rugs, double-sided tape under rugs    Time 3    Period Weeks    Status New    Target Date 10/31/20               PT Long Term Goals - 10/10/20 1658       PT LONG TERM GOAL #1   Title Pt will improve Berg balance test score to at least 45/56, in order to demonstrate decreased falls risk.    Baseline 38/56    Time 8    Period Weeks    Status New    Target Date 12/05/20      PT LONG TERM GOAL #2   Title Pt will decrease 5xSTS time to 12 seconds or less, in order to demonstrate improved LE strength and power production.    Baseline 17.34s    Time 8    Period Weeks    Status New    Target Date 12/05/20      PT LONG TERM GOAL #3   Title Pt will demonstrate STS with no knee valgus, indicating improved lateral hip stability and control.    Baseline knee valgus during descent    Time 8    Period Weeks    Status New    Target Date 12/05/20      PT LONG TERM GOAL #4   Title Pt will decrease TUG time with LRAD to 12s or less, in order to demonstrate improved gait speed and decreased fall fisk.    Baseline 14.25s    Time 8    Period Weeks    Status New    Target Date 12/05/20                    Plan - 10/10/20 1411     Clinical Impression Statement Ms. Strohm is an 85 yo female who presents to OP PT s/p fall on 8/24, tripping over rug exiting drug store and breaking her nose. She has since been using a small quad based cane with no subsequent falls, but activity level has decreased and  she has not driven due to headache. Headache is starting to subside and pt plans to begin driving again. She demonstrates increased fall risk based on TUG score of 14.25 seconds and Berg balance test score of 38/56. Pt ambulates with cane on R side, taking small steps with decreased foot clearanc, reciprocal arm swing and lateral sway. She is stiff and guarded with gait, as well as fearful of falling. Pt was educated on diagnosis, prognosis, POC, HEP, safeguarding home with double sided tape under rugs, considering change of high lipped rugs that can increase fall risk, and use of rollator in community for energy conservation and increased support. She consented to treatment and verbalized understanding. Pt would benefit from skilled PT 1x/week for 6-8 weeks to address balance deficits and decrease fall risk.    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 Directives, Assess HEP/update PRN, progress static/dynamic balance, hip/LE strengthening    PT Home Exercise Plan [place] and Agree with Plan of Care Patient             Patient will benefit from skilled therapeutic intervention in order to  improve the following deficits and impairments:  Abnormal gait, Decreased coordination, Difficulty walking, Postural dysfunction, Decreased strength, Decreased mobility, Improper body mechanics, Impaired flexibility, Decreased balance, Pain, Impaired perceived functional ability, Decreased activity tolerance, Decreased endurance  Visit Diagnosis: Unsteadiness on feet  Muscle weakness (generalized)  Abnormal posture  Other abnormalities of gait and mobility     Problem List Patient Active Problem List   Diagnosis Date Noted   Urinary urgency 08/22/2020   Anxiety 08/22/2020   Atrophic vaginitis 08/22/2020   Dysuria 08/22/2020   Female stress incontinence 08/22/2020   Closed fracture of right side of maxilla with routine healing 05/21/2020    Izell Loami, PT, DPT 10/10/2020, 6:06 PM  [place]. [place], [place], [address] Phone: [phone]   Fax:  [phone]  Name: TENLEIGH ORQUIZ MRN: [id number] Date of Birth: 1935/11/23

## 2020-10-17 DIAGNOSIS — H401211 Low-tension glaucoma, right eye, mild stage: Secondary | ICD-10-CM | POA: Diagnosis not present

## 2020-10-21 ENCOUNTER — Ambulatory Visit: Payer: Medicare Other | Admitting: Physical Therapy

## 2020-10-22 DIAGNOSIS — Z23 Encounter for immunization: Secondary | ICD-10-CM | POA: Diagnosis not present

## 2020-10-24 ENCOUNTER — Encounter: Payer: Medicare Other | Admitting: Podiatry

## 2020-10-28 ENCOUNTER — Encounter: Payer: Self-pay | Admitting: Physical Therapy

## 2020-10-28 ENCOUNTER — Ambulatory Visit: Payer: Medicare Other | Admitting: Physical Therapy

## 2020-10-28 ENCOUNTER — Other Ambulatory Visit: Payer: Self-pay

## 2020-10-28 DIAGNOSIS — R293 Abnormal posture: Secondary | ICD-10-CM

## 2020-10-28 DIAGNOSIS — M6281 Muscle weakness (generalized): Secondary | ICD-10-CM

## 2020-10-28 DIAGNOSIS — R2681 Unsteadiness on feet: Secondary | ICD-10-CM | POA: Diagnosis not present

## 2020-10-28 DIAGNOSIS — R2689 Other abnormalities of gait and mobility: Secondary | ICD-10-CM | POA: Diagnosis not present

## 2020-10-28 NOTE — Therapy (Signed)
[place]. [place], [place], [address] Phone: [phone]   Fax:  [phone]  Physical Therapy Treatment  Patient Details  Name: Debra Hudson MRN: [id number] Date of Birth: May 21, 1935 Referring Provider (PT): Lavone Orn, MD   Encounter Date: 10/28/2020   PT End of Session - 10/28/20 1344     Visit Number 2    Authorization Type Medicare    PT Start Time 1300    PT Stop Time 1344    PT Time Calculation (min) 44 min    Activity Tolerance Patient tolerated treatment well    Behavior During Therapy [facility] for tasks assessed/performed             Past Medical History:  Diagnosis Date   Diabetes mellitus without complication ([place])    Hypertension     History reviewed. No pertinent surgical history.  There were no vitals filed for this visit.   Subjective Assessment - 10/28/20 1303     Subjective "Cant say Im feel really good, but some what better" not able to walk around really good    Currently in Pain? No/denies                               [facility] Adult PT Treatment/Exercise - 10/28/20 0001       High Level Balance   High Level Balance Activities Side stepping;Backward walking      Exercises   Exercises Lumbar      Lumbar Exercises: Aerobic   Nustep L3 x 6 min      Lumbar Exercises: Standing   Other Standing Lumbar Exercises Alt 4in box taps w/ SPC 2x10    Other Standing Lumbar Exercises Standing march SPC 2x10      Lumbar Exercises: Seated   Long Arc Quad on Chair Strengthening;Both;2 sets;10 reps    LAQ on Chair Weights (lbs) 2    Sit to Stand 10 reps;5 reps   UE on LE   Other Seated Lumbar Exercises Seated march 2lb 2x10, Ball squeezes    Other Seated Lumbar Exercises hamstring curls red 2x10                       PT Short Term Goals - 10/10/20 1658       PT SHORT TERM GOAL #1   Title Pt will be independent with initial HEP    Time 3     Period Weeks    Status New    Target Date 10/31/20      PT SHORT TERM GOAL #2   Title Pt will demo understanding of falls prevention in the home, i.e. nonslip rugs, double-sided tape under rugs    Time 3    Period Weeks    Status New    Target Date 10/31/20               PT Long Term Goals - 10/10/20 1658       PT LONG TERM GOAL #1   Title Pt will improve Berg balance test score to at least 45/56, in order to demonstrate decreased falls risk.    Baseline 38/56    Time 8    Period Weeks    Status New    Target Date 12/05/20      PT LONG TERM GOAL #2   Title Pt will decrease 5xSTS time to 12 seconds or less,  in order to demonstrate improved LE strength and power production.    Baseline 17.34s    Time 8    Period Weeks    Status New    Target Date 12/05/20      PT LONG TERM GOAL #3   Title Pt will demonstrate STS with no knee valgus, indicating improved lateral hip stability and control.    Baseline knee valgus during descent    Time 8    Period Weeks    Status New    Target Date 12/05/20      PT LONG TERM GOAL #4   Title Pt will decrease TUG time with LRAD to 12s or less, in order to demonstrate improved gait speed and decreased fall fisk.    Baseline 14.25s    Time 8    Period Weeks    Status New    Target Date 12/05/20                   Plan - 10/28/20 1344     Clinical Impression Statement Pt tolerated an initial progression to TE well evident by no subjective reports of increase pain. She voiced concerned about her LLE strength and would like to get it stronger. Bilateral LE weakness noted with the interventions. Instability noted with standing marches, and alt box taps. 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 Directives, Assess HEP/update PRN, progress static/dynamic balance, hip/LE strengthening             Patient will benefit from skilled therapeutic intervention in order to improve the following deficits and impairments:  Abnormal gait, Decreased coordination, Difficulty walking, Postural dysfunction, Decreased strength, Decreased mobility, Improper body mechanics, Impaired flexibility, Decreased balance, Pain, Impaired perceived functional ability, Decreased activity tolerance, Decreased endurance  Visit Diagnosis: Unsteadiness on feet  Muscle weakness (generalized)  Abnormal posture     Problem List Patient Active Problem List   Diagnosis Date Noted   Urinary urgency 08/22/2020   Anxiety 08/22/2020   Atrophic vaginitis 08/22/2020   Dysuria 08/22/2020   Female stress incontinence 08/22/2020   Closed fracture of right side of maxilla with routine healing 05/21/2020    Scot Jun, PTA 10/28/2020, 1:51 PM  [place]. [place], [place], [address] Phone: [phone]   Fax:  [phone]  Name: Debra Hudson MRN: [id number] Date of Birth: November 26, 1935

## 2020-10-31 ENCOUNTER — Encounter: Payer: Medicare Other | Admitting: Podiatry

## 2020-11-04 ENCOUNTER — Encounter: Payer: Self-pay | Admitting: Physical Therapy

## 2020-11-04 ENCOUNTER — Ambulatory Visit: Payer: Medicare Other | Attending: Internal Medicine | Admitting: Physical Therapy

## 2020-11-04 ENCOUNTER — Other Ambulatory Visit: Payer: Self-pay

## 2020-11-04 DIAGNOSIS — R2681 Unsteadiness on feet: Secondary | ICD-10-CM | POA: Insufficient documentation

## 2020-11-04 DIAGNOSIS — R2689 Other abnormalities of gait and mobility: Secondary | ICD-10-CM | POA: Insufficient documentation

## 2020-11-04 DIAGNOSIS — M79675 Pain in left toe(s): Secondary | ICD-10-CM | POA: Diagnosis not present

## 2020-11-04 DIAGNOSIS — R293 Abnormal posture: Secondary | ICD-10-CM | POA: Diagnosis not present

## 2020-11-04 DIAGNOSIS — M6281 Muscle weakness (generalized): Secondary | ICD-10-CM | POA: Diagnosis not present

## 2020-11-04 NOTE — Therapy (Signed)
[facility] [address] [place], [place], [address] Phone: [phone]   Fax:  [phone]  Physical Therapy Treatment  Patient Details  Name: Debra Hudson MRN: [id number] Date of Birth: 12/08/1935 Referring Provider (PT): John Griffin, MD   Encounter Date: 11/04/2020   PT End of Session - 11/04/20 1343     Visit Number 3    Number of Visits 10    Date for PT Re-Evaluation 12/19/20    Authorization Type Medicare    PT Start Time 1300    PT Stop Time 1343    PT Time Calculation (min) 43 min    Activity Tolerance Patient tolerated treatment well    Behavior During Therapy WFL for tasks assessed/performed             Past Medical History:  Diagnosis Date   Diabetes mellitus without complication ([facility])    Hypertension     History reviewed. No pertinent surgical history.  There were no vitals filed for this visit.   Subjective Assessment - 11/04/20 1304     Subjective "I am ok"    Currently in Pain? No/denies                               [facility] Adult PT Treatment/Exercise - 11/04/20 0001       Lumbar Exercises: Aerobic   Nustep L3 x 6 min      Lumbar Exercises: Machines for Strengthening   Cybex Knee Extension 5lb 2x10    Cybex Knee Flexion 15lb 2x10      Lumbar Exercises: Standing   Other Standing Lumbar Exercises Alt 4in then 6in  box taps w/ SPC 2x10; 4 inch step up 2x5 each    Other Standing Lumbar Exercises resisted gait 20lb fwd/bak x3, 10lb side step x 3 each      Lumbar Exercises: Seated   Sit to Stand 5 reps   x3, UE on LE   Other Seated Lumbar Exercises Hip abd 2x15 green tband                       PT Short Term Goals - 11/04/20 1349       PT SHORT TERM GOAL #1   Title Pt will be independent with initial HEP    Status Achieved      PT SHORT TERM GOAL #2   Title Pt will demo understanding of falls prevention in the home, i.e. nonslip rugs,  double-sided tape under rugs    Status Partially Met               PT Long Term Goals - 10/10/20 1658       PT LONG TERM GOAL #1   Title Pt will improve Berg balance test score to at least 45/56, in order to demonstrate decreased falls risk.    Baseline 38/56    Time 8    Period Weeks    Status New    Target Date 12/05/20      PT LONG TERM GOAL #2   Title Pt will decrease 5xSTS time to 12 seconds or less, in order to demonstrate improved LE strength and power production.    Baseline 17.34s    Time 8    Period Weeks    Status New    Target Date 12/05/20      PT LONG TERM GOAL #3     Title Pt will demonstrate STS with no knee valgus, indicating improved lateral hip stability and control.    Baseline knee valgus during descent    Time 8    Period Weeks    Status New    Target Date 12/05/20      PT LONG TERM GOAL #4   Title Pt will decrease TUG time with LRAD to 12s or less, in order to demonstrate improved gait speed and decreased fall fisk.    Baseline 14.25s    Time 8    Period Weeks    Status New    Target Date 12/05/20                   Plan - 11/04/20 1343     Clinical Impression Statement Pt did well with a progressed treatment session. Some initial difficulty with sit to stand but pt able to complete with sequencing cues. Some instability initially with resisted gait but did well as she progressed with reps. Cue not to pull up with UE doing step ups. LE fatigue with machine level interventions,    Personal Factors and Comorbidities Age;Fitness    Examination-Activity Limitations Locomotion Level;Reach Overhead;Bend;Caring for Others;Squat;Stairs;Stand;Toileting;Dressing;Transfers    Stability/Clinical Decision Making Evolving/Moderate complexity    Rehab Potential Good    PT Frequency 1x / week    PT Duration 8 weeks    PT Treatment/Interventions ADLs/Self Care Home Management;Electrical Stimulation;Moist Heat;DME Instruction;Neuromuscular  re-education;Balance training;Therapeutic exercise;Therapeutic activities;Patient/family education;Stair training;Gait training;Functional mobility training;Manual techniques;Energy conservation;Taping;Vestibular;Visual/perceptual remediation/compensation    PT Next Visit Plan HEP/update PRN, progress static/dynamic balance, hip/LE strengthening             Patient will benefit from skilled therapeutic intervention in order to improve the following deficits and impairments:  Abnormal gait, Decreased coordination, Difficulty walking, Postural dysfunction, Decreased strength, Decreased mobility, Improper body mechanics, Impaired flexibility, Decreased balance, Pain, Impaired perceived functional ability, Decreased activity tolerance, Decreased endurance  Visit Diagnosis: Abnormal posture  Muscle weakness (generalized)  Unsteadiness on feet     Problem List Patient Active Problem List   Diagnosis Date Noted   Urinary urgency 08/22/2020   Anxiety 08/22/2020   Atrophic vaginitis 08/22/2020   Dysuria 08/22/2020   Female stress incontinence 08/22/2020   Closed fracture of right side of maxilla with routine healing 05/21/2020    Ronald G Pemberton, PTA 11/04/2020, 1:49 PM  [facility] [address] [place], [place], [address] Phone: [phone]   Fax:  [phone]  Name: Debra Hudson MRN: [id number] Date of Birth: 04/17/1935    

## 2020-11-05 ENCOUNTER — Encounter: Payer: Medicare Other | Admitting: Podiatry

## 2020-11-07 ENCOUNTER — Encounter: Payer: Medicare Other | Admitting: Podiatry

## 2020-11-11 DIAGNOSIS — Z23 Encounter for immunization: Secondary | ICD-10-CM | POA: Diagnosis not present

## 2020-11-12 ENCOUNTER — Encounter: Payer: Self-pay | Admitting: Physical Therapy

## 2020-11-12 ENCOUNTER — Other Ambulatory Visit: Payer: Self-pay

## 2020-11-12 ENCOUNTER — Ambulatory Visit: Payer: Medicare Other | Admitting: Physical Therapy

## 2020-11-12 DIAGNOSIS — M79675 Pain in left toe(s): Secondary | ICD-10-CM | POA: Diagnosis not present

## 2020-11-12 DIAGNOSIS — R2689 Other abnormalities of gait and mobility: Secondary | ICD-10-CM

## 2020-11-12 DIAGNOSIS — M6281 Muscle weakness (generalized): Secondary | ICD-10-CM | POA: Diagnosis not present

## 2020-11-12 DIAGNOSIS — R2681 Unsteadiness on feet: Secondary | ICD-10-CM

## 2020-11-12 DIAGNOSIS — R293 Abnormal posture: Secondary | ICD-10-CM | POA: Diagnosis not present

## 2020-11-12 NOTE — Therapy (Signed)
[address] [place], [place], [address] Phone: [phone]   Fax:  [phone]  Physical Therapy Treatment  Patient Details  Name: Debra Hudson MRN: [id number] Date of Birth: 1935/07/08 Referring Provider (PT): Lavone Orn, MD   Encounter Date: 11/12/2020   PT End of Session - 11/12/20 1343     Visit Number 4    Number of Visits 10    Date for PT Re-Evaluation 12/19/20    Authorization Type Medicare    PT Start Time 1300    PT Stop Time 7616    PT Time Calculation (min) 43 min    Activity Tolerance Patient tolerated treatment well    Behavior During Therapy [facility] for tasks assessed/performed             Past Medical History:  Diagnosis Date   Diabetes mellitus without complication ([place])    Hypertension     History reviewed. No pertinent surgical history.  There were no vitals filed for this visit.   Subjective Assessment - 11/12/20 1303     Subjective Pt enters clinic reporting that she left her cane in the car, no fall, reports a sore R arm form Covid shot    Currently in Pain? No/denies                [facility] PT Assessment - 11/12/20 0001       Standardized Balance Assessment   Five times sit to stand comments  15.96   UE push from LE.     Berg Balance Test   Sit to Stand Able to stand without using hands and stabilize independently    Standing Unsupported Able to stand safely 2 minutes    Sitting with Back Unsupported but Feet Supported on Floor or Stool Able to sit safely and securely 2 minutes    Stand to Sit Sits safely with minimal use of hands    Transfers Able to transfer safely, minor use of hands    Standing Unsupported with Eyes Closed Able to stand 10 seconds safely    Standing Unsupported with Feet Together Able to place feet together independently and stand for 1 minute with supervision    From Standing, Reach Forward with Outstretched Arm Can reach confidently >25 cm  (10")    From Standing Position, Pick up Object from Floor Able to pick up shoe safely and easily    From Standing Position, Turn to Look Behind Over each Shoulder Looks behind one side only/other side shows less weight shift    Turn 360 Degrees Able to turn 360 degrees safely but slowly    Standing Unsupported, Alternately Place Feet on Step/Stool Able to complete 4 steps without aid or supervision    Standing Unsupported, One Foot in [employer] balance while stepping or standing    Standing on One Leg Tries to lift leg/unable to hold 3 seconds but remains standing independently    Total Score 43      Timed Up and Go Test   Normal TUG (seconds) 17.79   Average of 3 No AD fastest time "12.31"                          [facility] Adult PT Treatment/Exercise - 11/12/20 0001       Lumbar Exercises: Aerobic   Nustep L3 x 6 min      Lumbar Exercises: Machines for Strengthening   Cybex Knee Extension  5lb 2x10    Cybex Knee Flexion 20lb 2x10      Lumbar Exercises: Standing   Other Standing Lumbar Exercises 4 inch step up 2x5 each    Other Standing Lumbar Exercises 10lb side step x 3 each      Lumbar Exercises: Seated   Sit to Stand 5 reps   x2 elevated surface LE on airex                      PT Short Term Goals - 11/12/20 1307       PT SHORT TERM GOAL #1   Title Pt will be independent with initial HEP    Status Achieved               PT Long Term Goals - 11/12/20 1325       PT LONG TERM GOAL #2   Title Pt will decrease 5xSTS time to 12 seconds or less, in order to demonstrate improved LE strength and power production.    Status On-going      PT LONG TERM GOAL #3   Title Pt will demonstrate STS with no knee valgus, indicating improved lateral hip stability and control.    Status On-going      PT LONG TERM GOAL #4   Title Pt will decrease TUG time with LRAD to 12s or less, in order to demonstrate improved gait speed and decreased fall fisk.     Status Partially Met                   Plan - 11/12/20 1344     Clinical Impression Statement Pt enters clinic without SPC. She has progress towards goals meeting some. Difficulty with tandem and SL stance. increase resistance tolerated with hamstring curls. Cue needed to only use UE for stability and to not pull herself up with step ups. Airex sit to stands was difficult regards to LE weakness but able to maintain stability when standing.    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 Evolving/Moderate complexity    Rehab Potential Good    PT Frequency 1x / week    PT Duration 8 weeks    PT Treatment/Interventions ADLs/Self Care Home Management;Electrical Stimulation;Moist Heat;DME Instruction;Neuromuscular re-education;Balance training;Therapeutic exercise;Therapeutic activities;Patient/family education;Stair training;Gait training;Functional mobility training;Manual techniques;Energy conservation;Taping;Vestibular;Visual/perceptual remediation/compensation    PT Next Visit Plan progress static/dynamic balance, hip/LE strengthening             Patient will benefit from skilled therapeutic intervention in order to improve the following deficits and impairments:  Abnormal gait, Decreased coordination, Difficulty walking, Postural dysfunction, Decreased strength, Decreased mobility, Improper body mechanics, Impaired flexibility, Decreased balance, Pain, Impaired perceived functional ability, Decreased activity tolerance, Decreased endurance  Visit Diagnosis: Abnormal posture  Unsteadiness on feet  Other abnormalities of gait and mobility  Pain in left toe(s)  Muscle weakness (generalized)     Problem List Patient Active  Problem List   Diagnosis Date Noted   Urinary urgency 08/22/2020   Anxiety 08/22/2020   Atrophic vaginitis 08/22/2020   Dysuria 08/22/2020   Female stress incontinence 08/22/2020   Closed fracture of right side of maxilla with routine healing 05/21/2020    Scot Jun, PTA 11/12/2020, 1:47 PM  [address] [place], [place], [address] Phone: [phone]   Fax:  [phone]  Name: Debra Hudson MRN: [id number] Date of Birth: 05/04/1935

## 2020-11-14 ENCOUNTER — Encounter: Payer: Medicare Other | Admitting: Podiatry

## 2020-11-21 ENCOUNTER — Encounter: Payer: Medicare Other | Admitting: Podiatry

## 2020-11-25 ENCOUNTER — Ambulatory Visit: Payer: Medicare Other | Admitting: Physical Therapy

## 2020-11-27 ENCOUNTER — Telehealth: Payer: Self-pay | Admitting: *Deleted

## 2020-11-27 ENCOUNTER — Other Ambulatory Visit: Payer: Self-pay | Admitting: Podiatry

## 2020-11-27 MED ORDER — CEPHALEXIN 500 MG PO CAPS: 500.0000 mg | ORAL_CAPSULE | Freq: Three times a day (TID) | ORAL | 0 refills | Status: DC

## 2020-11-27 MED ORDER — ONDANSETRON HCL 4 MG PO TABS: 4.0000 mg | ORAL_TABLET | Freq: Three times a day (TID) | ORAL | 0 refills | Status: DC | PRN

## 2020-11-27 MED ORDER — TRAMADOL HCL 50 MG PO TABS: 50.0000 mg | ORAL_TABLET | Freq: Three times a day (TID) | ORAL | 0 refills | Status: AC | PRN

## 2020-11-27 NOTE — Telephone Encounter (Signed)
Patient is calling and wanted to know if she is to not start taking any of the medications prescribed until after surgery including the antibiotic?Please advise.

## 2020-11-27 NOTE — Telephone Encounter (Signed)
He sent in her post op meds today for her surgery Friday, so yes, she is to wait to start those after her surgery.

## 2020-11-28 ENCOUNTER — Ambulatory Visit: Payer: Medicare Other | Admitting: Physical Therapy

## 2020-11-28 ENCOUNTER — Encounter: Payer: Medicare Other | Admitting: Podiatry

## 2020-11-28 NOTE — Telephone Encounter (Signed)
Patient is calling to ask is she should begin taking the two prescriptions prescribed before or after surgery. Called and explained that per [facility] who had spoken with daughter that she is to hold off taking medicines until surgery.She verbalized understanding.

## 2020-11-29 DIAGNOSIS — M19071 Primary osteoarthritis, right ankle and foot: Secondary | ICD-10-CM | POA: Diagnosis not present

## 2020-11-29 DIAGNOSIS — M2042 Other hammer toe(s) (acquired), left foot: Secondary | ICD-10-CM

## 2020-11-29 DIAGNOSIS — M25571 Pain in right ankle and joints of right foot: Secondary | ICD-10-CM | POA: Diagnosis not present

## 2020-11-29 DIAGNOSIS — M25572 Pain in left ankle and joints of left foot: Secondary | ICD-10-CM | POA: Diagnosis not present

## 2020-12-02 ENCOUNTER — Telehealth: Payer: Self-pay | Admitting: *Deleted

## 2020-12-02 NOTE — Telephone Encounter (Signed)
Patient is calling to let the doctor know that she is having on pain in the surgical area,has only taken 1/2 of a pain pill,small discomfort in back and legs from using the walker. Everything else is doing well.

## 2020-12-05 ENCOUNTER — Encounter: Payer: Medicare Other | Admitting: Podiatry

## 2020-12-05 ENCOUNTER — Other Ambulatory Visit: Payer: Self-pay

## 2020-12-05 ENCOUNTER — Ambulatory Visit (INDEPENDENT_AMBULATORY_CARE_PROVIDER_SITE_OTHER): Payer: Medicare Other | Admitting: Podiatry

## 2020-12-05 ENCOUNTER — Encounter: Payer: Self-pay | Admitting: Podiatry

## 2020-12-05 DIAGNOSIS — M2042 Other hammer toe(s) (acquired), left foot: Secondary | ICD-10-CM

## 2020-12-05 DIAGNOSIS — Z9889 Other specified postprocedural states: Secondary | ICD-10-CM

## 2020-12-05 NOTE — Progress Notes (Signed)
She presents today for her first postop visit date of surgery 11/21/2020 disarticulation second toe at the metatarsal phalangeal joint she states that she has had absolutely no pain and has been doing very well has been using her walker with her cam walker.  She states that she seems fairly stable with that but would like to go down to a shoe if possible.  Objective: Vital signs are stable alert and oriented x3.  Pulses are palpable.  There is no erythema edema cellulitis drainage or odor once the dressing was removed.  Sutures are intact margins are well coapted  Assessment: Well-healing surgical foot.  Plan: Redressed today dressed a compressive dressing.  Follow-up with her in 1 week placed her in a Darco shoe

## 2020-12-12 ENCOUNTER — Ambulatory Visit (INDEPENDENT_AMBULATORY_CARE_PROVIDER_SITE_OTHER): Payer: Medicare Other | Admitting: Podiatry

## 2020-12-12 ENCOUNTER — Encounter: Payer: Medicare Other | Admitting: Podiatry

## 2020-12-12 ENCOUNTER — Encounter: Payer: Self-pay | Admitting: Podiatry

## 2020-12-12 ENCOUNTER — Other Ambulatory Visit: Payer: Self-pay

## 2020-12-12 DIAGNOSIS — M2042 Other hammer toe(s) (acquired), left foot: Secondary | ICD-10-CM

## 2020-12-12 DIAGNOSIS — Z9889 Other specified postprocedural states: Secondary | ICD-10-CM

## 2020-12-12 NOTE — Progress Notes (Signed)
She presents today for her second postop visit date of surgery 11/29/2020 disarticulation second toe left foot.  States that she really has not had any pain down there at all.  Objective: She presents today with dry sterile compressive dressing.  Dressel dressing was removed demonstrate sutures are intact she has a small dehiscence the very distal aspect of the incision ongoing leave the stitches in for 1 more week.  Redressed the foot today.  Assessment: Healed surgical foot delay and skin healing distally at the incision.  Plan: Redressed foot dressed a compressive dressing continue use of Darco shoe to keep the foot dry and clean.

## 2020-12-19 ENCOUNTER — Ambulatory Visit (INDEPENDENT_AMBULATORY_CARE_PROVIDER_SITE_OTHER): Payer: Medicare Other | Admitting: Podiatry

## 2020-12-19 ENCOUNTER — Other Ambulatory Visit: Payer: Self-pay

## 2020-12-19 DIAGNOSIS — M2042 Other hammer toe(s) (acquired), left foot: Secondary | ICD-10-CM

## 2020-12-19 DIAGNOSIS — Z9889 Other specified postprocedural states: Secondary | ICD-10-CM

## 2020-12-22 NOTE — Progress Notes (Signed)
She presents for her third postop visit date of surgery 11/29/2020 disarticulation second toe left foot.  Denies fever chills nausea Muscle aches and pains states that seems to be doing okay.  Objective: Dry sterile dressing intact was removed demonstrates sutures are intact margins well coapted sutures removed today.  Assessment: Healed surgical foot left.  Plan: She put her regular sock on after the sutures were removed with a tennis shoe we will follow-up with me in a few weeks

## 2020-12-24 ENCOUNTER — Encounter: Payer: Medicare Other | Admitting: Podiatry

## 2021-01-07 ENCOUNTER — Encounter: Payer: Medicare Other | Admitting: Podiatry

## 2021-01-14 ENCOUNTER — Encounter: Payer: Medicare Other | Admitting: Podiatry

## 2021-01-16 ENCOUNTER — Ambulatory Visit (INDEPENDENT_AMBULATORY_CARE_PROVIDER_SITE_OTHER): Payer: Medicare Other | Admitting: Podiatry

## 2021-01-16 ENCOUNTER — Other Ambulatory Visit: Payer: Self-pay

## 2021-01-16 ENCOUNTER — Encounter: Payer: Self-pay | Admitting: Podiatry

## 2021-01-16 DIAGNOSIS — M2042 Other hammer toe(s) (acquired), left foot: Secondary | ICD-10-CM

## 2021-01-16 DIAGNOSIS — Z9889 Other specified postprocedural states: Secondary | ICD-10-CM

## 2021-01-18 NOTE — Progress Notes (Signed)
She presents today for follow-up of her disarticulation date of surgery 11/29/2020 amputation of the second digit left foot.  She denies fever chills nausea vomiting states that it has not been the first problem.  Objective: Vital signs are stable alert oriented x3.  Pulses are palpable.  Amputation site is going to heal uneventfully.  There is a small dogear present.  Hallux valgus deformity is present and medial deviation of the remaining lesser toes.  Assessment: Well-healing surgical foot.  Plan: Provided her with a stabilizer and separator to keep the first and second toes from touching.  I will follow-up with her only on an as-needed basis.  She is released from surgery

## 2021-03-13 DIAGNOSIS — Z20822 Contact with and (suspected) exposure to covid-19: Secondary | ICD-10-CM | POA: Diagnosis not present

## 2021-03-26 DIAGNOSIS — E782 Mixed hyperlipidemia: Secondary | ICD-10-CM | POA: Diagnosis not present

## 2021-03-26 DIAGNOSIS — I10 Essential (primary) hypertension: Secondary | ICD-10-CM | POA: Diagnosis not present

## 2021-03-26 DIAGNOSIS — E1169 Type 2 diabetes mellitus with other specified complication: Secondary | ICD-10-CM | POA: Diagnosis not present

## 2021-04-01 ENCOUNTER — Ambulatory Visit (INDEPENDENT_AMBULATORY_CARE_PROVIDER_SITE_OTHER): Payer: Medicare Other | Admitting: Podiatry

## 2021-04-01 ENCOUNTER — Other Ambulatory Visit: Payer: Self-pay

## 2021-04-01 ENCOUNTER — Encounter: Payer: Self-pay | Admitting: Podiatry

## 2021-04-01 DIAGNOSIS — G5793 Unspecified mononeuropathy of bilateral lower limbs: Secondary | ICD-10-CM

## 2021-04-02 NOTE — Progress Notes (Signed)
She presents today with her daughter chief complaint of numbness to her bilateral toes states that she is having a hard time walking loses her balance a lot and has fallen 5 times injuring her nose on her last fall  Objective: Vital signs are stable alert and oriented x3 slightly diminished sensation to the tufts of the toes only no open lesions or wounds.  Pulses are strong and palpable.  Assessment: Neuropathy and fall risk.  Plan: Referring to neurology for evaluation and treatment of neuropathy and loss of balance.  Patient would probably benefit from physical therapy and balance training.

## 2021-04-09 DIAGNOSIS — Z20822 Contact with and (suspected) exposure to covid-19: Secondary | ICD-10-CM | POA: Diagnosis not present

## 2021-04-16 DIAGNOSIS — Z20822 Contact with and (suspected) exposure to covid-19: Secondary | ICD-10-CM | POA: Diagnosis not present

## 2021-04-16 DIAGNOSIS — F4321 Adjustment disorder with depressed mood: Secondary | ICD-10-CM | POA: Diagnosis not present

## 2021-04-16 DIAGNOSIS — E2839 Other primary ovarian failure: Secondary | ICD-10-CM | POA: Diagnosis not present

## 2021-04-16 DIAGNOSIS — E782 Mixed hyperlipidemia: Secondary | ICD-10-CM | POA: Diagnosis not present

## 2021-04-16 DIAGNOSIS — Z0000 Encounter for general adult medical examination without abnormal findings: Secondary | ICD-10-CM | POA: Diagnosis not present

## 2021-04-16 DIAGNOSIS — E042 Nontoxic multinodular goiter: Secondary | ICD-10-CM | POA: Diagnosis not present

## 2021-04-16 DIAGNOSIS — Z1389 Encounter for screening for other disorder: Secondary | ICD-10-CM | POA: Diagnosis not present

## 2021-04-16 DIAGNOSIS — E0590 Thyrotoxicosis, unspecified without thyrotoxic crisis or storm: Secondary | ICD-10-CM | POA: Diagnosis not present

## 2021-04-16 DIAGNOSIS — I10 Essential (primary) hypertension: Secondary | ICD-10-CM | POA: Diagnosis not present

## 2021-04-16 DIAGNOSIS — E1169 Type 2 diabetes mellitus with other specified complication: Secondary | ICD-10-CM | POA: Diagnosis not present

## 2021-04-16 DIAGNOSIS — R200 Anesthesia of skin: Secondary | ICD-10-CM | POA: Diagnosis not present

## 2021-04-16 DIAGNOSIS — R269 Unspecified abnormalities of gait and mobility: Secondary | ICD-10-CM | POA: Diagnosis not present

## 2021-04-16 DIAGNOSIS — F324 Major depressive disorder, single episode, in partial remission: Secondary | ICD-10-CM | POA: Diagnosis not present

## 2021-04-16 DIAGNOSIS — R413 Other amnesia: Secondary | ICD-10-CM | POA: Diagnosis not present

## 2021-05-02 DIAGNOSIS — Z20822 Contact with and (suspected) exposure to covid-19: Secondary | ICD-10-CM | POA: Diagnosis not present

## 2021-05-05 DIAGNOSIS — Z20822 Contact with and (suspected) exposure to covid-19: Secondary | ICD-10-CM | POA: Diagnosis not present

## 2021-05-06 DIAGNOSIS — Z20822 Contact with and (suspected) exposure to covid-19: Secondary | ICD-10-CM | POA: Diagnosis not present

## 2021-05-14 ENCOUNTER — Ambulatory Visit: Payer: Medicare Other | Attending: Internal Medicine | Admitting: Physical Therapy

## 2021-05-14 ENCOUNTER — Encounter: Payer: Self-pay | Admitting: Physical Therapy

## 2021-05-14 DIAGNOSIS — R293 Abnormal posture: Secondary | ICD-10-CM | POA: Insufficient documentation

## 2021-05-14 DIAGNOSIS — M6281 Muscle weakness (generalized): Secondary | ICD-10-CM | POA: Diagnosis not present

## 2021-05-14 DIAGNOSIS — M79675 Pain in left toe(s): Secondary | ICD-10-CM | POA: Diagnosis not present

## 2021-05-14 DIAGNOSIS — R2689 Other abnormalities of gait and mobility: Secondary | ICD-10-CM | POA: Diagnosis not present

## 2021-05-14 DIAGNOSIS — R2681 Unsteadiness on feet: Secondary | ICD-10-CM | POA: Insufficient documentation

## 2021-05-14 NOTE — Therapy (Signed)
[address] ?[place], [place], [address] ?Phone: [phone]   Fax:  [phone] ? ?Physical Therapy Evaluation ? ?Patient Details  ?Name: Debra Hudson ?MRN: [id number] ?Date of Birth: 29-Mar-1935 ?Referring Provider (PT): Lavone Orn, MD ? ? ?Encounter Date: 05/14/2021 ? ? PT End of Session - 05/14/21 1438   ? ? Visit Number 1   ? Number of Visits 20   ? Date for PT Re-Evaluation 07/23/21   ? PT Start Time 1102   ? PT Stop Time 6378   ? PT Time Calculation (min) 40 min   ? Activity Tolerance Patient tolerated treatment well   ? Behavior During Therapy [facility] for tasks assessed/performed   ? ?  ?  ? ?  ? ? ?Past Medical History:  ?Diagnosis Date  ? Diabetes mellitus without complication ([place])   ? Hypertension   ? ? ?History reviewed. No pertinent surgical history. ? ?There were no vitals filed for this visit. ? ? ? Subjective Assessment - 05/14/21 1106   ? ? Subjective Patient reports she lost her husband last June. Her second toe on her L foot has been bothering her. She had an amputation of the toe. Now her great toe is bothering her. she returned to her foot Dr who referred her to a neurologist. Her appointment is next week. She had at least 5 falls last year, but has not fallen in at least 6 months. She woould lose her balance, usually while turning. She was referred by her primary physician. She arrives using a cane.   ? Pertinent History L second toe amputation.   ? Limitations Walking;House hold activities;Standing   ? How long can you sit comfortably? N/A   ? How long can you stand comfortably? She becomes unbalanced after 30 minutes.   ? How long can you walk comfortably? Maybe 10 minutes.   ? Diagnostic tests Cervical Spine XR: 1. No acute intracranial process.  2. Minimally displaced fractures of the bilateral nasal bones, with  overlying soft tissue swelling.  3. No acute cervical spine fracture. Extensive multilevel  spondylosis and facet  hypertrophy; CT Head: negative for intracranial abnormality   ? Patient Stated Goals Improved balance, walk without the cane, if possible, walk further without fatigue.   ? Currently in Pain? Yes   ? Pain Score 5    ? Pain Location Toe (Comment which one)   ? Pain Orientation Left   ? Pain Descriptors / Indicators Aching;Dull;Tingling   second toe of L foot, which has been amputated-phantom pain  ? Pain Type Chronic pain   ? Pain Onset More than a month ago   ? Pain Frequency Constant   ? Aggravating Factors  walking   ? Pain Relieving Factors soaking the toe in Epsom Salts, wears a band aid on L Great toe to maintain position because the third toe is shifting toward the great toe.   ? ?  ?  ? ?  ? ? ? ? ? [facility] PT Assessment - 05/14/21 0001   ? ?  ? Assessment  ? Medical Diagnosis Abnormality of Gait   ? Referring Provider (PT) Lavone Orn, MD   ? Prior Therapy Yes, last year for unsteadiness   ?  ? Home Environment  ? Living Environment Private residence   ? Living Arrangements Alone   spouse passed in June  ? Available Help at Discharge Family   2 daughters  ? Type of Home House   ?  Home Access Level entry   ? Home Layout Two level;Able to live on main level with bedroom/bathroom   ? Home Equipment Bedside commode;Wheelchair - manual;Cane - quad;Walker - 2 wheels;Walker - 4 wheels   ?  ? Prior Function  ? Level of Independence Independent   ? Leisure Crossword and word search puzzle, some reading, plans to walk to neighbor   ?  ? Cognition  ? Overall Cognitive Status Within Functional Limits for tasks assessed   ?  ? Sensation  ? Light Touch Appears Intact   ?  ? Posture/Postural Control  ? Posture Comments L great toe is migrating laterally and third toe is migrating medially. Patient reports that the Dr gave her a spacer, but it irritates third toe, so she has not been wearing it.   ?  ? Ambulation/Gait  ? Gait Comments Patient walked 2 x 80' using cane and MI. Very slow, with decreased speed, step length,  decreased weight shift.   ?  ? Standardized Balance Assessment  ? Five times sit to stand comments  23.59   used hands on legs  ?  ? Timed Up and Go Test  ? Normal TUG (seconds) 19.57   ? ?  ?  ? ?  ? ? ? ? ? ? ? ? ? ? ? ? ? ?Objective measurements completed on examination: See above findings.  ? ? ? ? ? ? ? ? ? ? ? ? ? ? PT Education - 05/14/21 1437   ? ? Education Details POC   ? Person(s) Educated Patient   ? Methods Explanation   ? Comprehension Verbalized understanding   ? ?  ?  ? ?  ? ? ? PT Short Term Goals - 05/14/21 1444   ? ?  ? PT SHORT TERM GOAL #1  ? Title Pt will be independent with initial HEP   ? Time 4   ? Period Weeks   ? Status New   ? Target Date 06/11/21   ? ?  ?  ? ?  ? ? ? ? PT Long Term Goals - 05/14/21 1444   ? ?  ? PT LONG TERM GOAL #1  ? Title I with final HEP   ? Time 10   ? Period Weeks   ? Status New   ? Target Date 07/23/21   ?  ? PT LONG TERM GOAL #2  ? Title Pt will decrease 5xSTS time to 12 seconds or less, in order to demonstrate improved LE strength and power production.   ? Baseline 23.59   ? Time 10   ? Period Weeks   ? Status New   ? Target Date 07/23/21   ?  ? PT LONG TERM GOAL #3  ? Title Patient will score at least 45 on BERG to demonstrate improved balance.   ? Baseline TBD   ? Time 10   ? Period Weeks   ? Status New   ? Target Date 07/23/21   ?  ? PT LONG TERM GOAL #4  ? Title Pt will decrease TUG time with LRAD to 12s or less, in order to demonstrate improved gait speed and decreased fall fisk.   ? Baseline 19.57   ? Time 10   ? Period Weeks   ? Status New   ? Target Date 07/23/21   ? ?  ?  ? ?  ? ? ? ? ? ? ? ? ? Plan - 05/14/21 1440   ? ?  Clinical Impression Statement Patient reports losing her husband last June. She had a series of falls, but has not fallen in the past 6 months. she developed pain in her second toe of L foot, eventually had an amputation of the toe. Now her great toe is migrating laterally and third toe medially. She tried wearing a spacer provided by  her Dr, but it hurt her third toe. She presents with good strength through MMT, but demosntrates functional weakness, decreased balance, decreased confidence with gait. she will benefit from PT for strengthening, blaance, and functional moiblity re-education.   ? Personal Factors and Comorbidities Age;Fitness   ? Examination-Activity Limitations Locomotion Level;Reach Overhead;Bend;Caring for Others;Squat;Stairs;Stand;Toileting;Dressing;Transfers   ? Examination-Participation Restrictions Community Activity;Cleaning   ? Stability/Clinical Decision Making Evolving/Moderate complexity   ? Clinical Decision Making Moderate   ? Rehab Potential Good   ? PT Frequency 2x / week   ? PT Duration Other (comment)   10w  ? PT Treatment/Interventions ADLs/Self Care Home Management;Electrical Stimulation;Moist Heat;DME Instruction;Neuromuscular re-education;Balance training;Therapeutic exercise;Therapeutic activities;Patient/family education;Stair training;Gait training;Functional mobility training;Manual techniques;Energy conservation;Taping;Vestibular;Visual/perceptual remediation/compensation;Orthotic Fit/Training;Passive range of motion;Dry needling   ? PT Next Visit Plan Initiate/update HEP   ? Consulted and Agree with Plan of Care Patient   ? ?  ?  ? ?  ? ? ?Patient will benefit from skilled therapeutic intervention in order to improve the following deficits and impairments:  Abnormal gait, Decreased coordination, Difficulty walking, Postural dysfunction, Decreased strength, Decreased mobility, Improper body mechanics, Impaired flexibility, Decreased balance, Pain, Impaired perceived functional ability, Decreased activity tolerance, Decreased endurance ? ?Visit Diagnosis: ?Abnormal posture ? ?Unsteadiness on feet ? ?Other abnormalities of gait and mobility ? ?Muscle weakness (generalized) ? ? ? ? ?Problem List ?Patient Active Problem List  ? Diagnosis Date Noted  ? Urinary urgency 08/22/2020  ? Anxiety 08/22/2020  ?  Atrophic vaginitis 08/22/2020  ? Dysuria 08/22/2020  ? Female stress incontinence 08/22/2020  ? Closed fracture of right side of maxilla with routine healing 05/21/2020  ? ? ?Marcelina Morel, DPT ?05/14/2021, 2:50

## 2021-05-19 NOTE — Progress Notes (Signed)
? ?[facility] ? ?PATIENT: Debra Hudson ?DOB: Nov 17, 1935 ? ?REFERRING DOCTOR OR PCP: Tyson Dense D.P.M. ?SOURCE: Patient, notes from podiatry, imaging and lab reports, CT scan personally reviewed. ? ?_________________________________ ? ? ?HISTORICAL ? ?CHIEF COMPLAINT:  ?Chief Complaint  ?Patient presents with  ? New Patient (Initial Visit)  ?  Rm 1, w daughter. Pt referred for bilateral LE neuropathy eval. Pt has trouble walking, having to use a cane. Has had several falls past year. L foot surgery, had her L 2nd toe removed a few months ago. Numbness and tingling in feet. Has some memory concerns.   ? ? ?HISTORY OF PRESENT ILLNESS:  ?I had the pleasure of seeing your patient, Debra Hudson, at [facility] for neurologic consultation regarding her neuropathy. ? ?She is an 85 year old woman who reports a long history of pain and swelling in her second toe.   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    She had noted numbness in both feet around that time.   She has some tingling but no significant pain.    ? ?She is also reporting reduced balance.   Seh does well walking straight but has difficulty with turns and has had some falls.   ? ?She has had NIDDM x 1-2 year and is on metformin.       ? ?She notes reduced STM but good LTM.   Her daughters note that she does not recall a conversation a day later.     She has some difficulty remembering dates.  She lves alone.   She drives and does some shopping and goes with daughters other times.      ? ?She lost her husband last June and has had depression.   She takes citalopram.     ? ?Imaging: ?CT scan of the head 10/01/2020 was normal for age. ? ? ?REVIEW OF SYSTEMS: ?Constitutional: No fevers, chills, sweats, or change in appetite ?Eyes: No visual changes, double vision, eye pain ?Ear, nose and throat: No hearing loss, ear pain, nasal congestion, sore throat ?Cardiovascular: No chest pain, palpitations ?Respiratory:  No shortness of  breath at rest or with exertion.   No wheezes ?GastrointestinaI: No nausea, vomiting, diarrhea, abdominal pain, fecal incontinence ?Genitourinary:  No dysuria, urinary retention or frequency.  No nocturia. ?Musculoskeletal:  No neck pain, back pain ?Integumentary: No rash, pruritus, skin lesions ?Neurological: as above ?Psychiatric: No depression at this time.  No anxiety ?Endocrine: No palpitations, diaphoresis, change in appetite, change in weigh or increased thirst ?Hematologic/Lymphatic:  No anemia, purpura, petechiae. ?Allergic/Immunologic: No itchy/runny eyes, nasal congestion, recent allergic reactions, rashes ? ?ALLERGIES: ?Allergies  ?Allergen Reactions  ? Erythromycin Diarrhea  ? Lisinopril   ?  Other reaction(s): Other (See Comments)  ? Sertraline Hcl   ?  Other reaction(s): Other (See Comments)  ? ? ?HOME MEDICATIONS: ? ?Current Outpatient Medications:  ?  aspirin EC 81 MG tablet, Take 81 mg by mouth daily., Disp: , Rfl:  ?  citalopram (CELEXA) 20 MG tablet, Take 20 mg by mouth daily., Disp: , Rfl:  ?  furosemide (LASIX) 40 MG tablet, Take 40 mg by mouth daily., Disp: , Rfl:  ?  latanoprost (XALATAN) 0.005 % ophthalmic solution, 1 drop at bedtime., Disp: , Rfl:  ?  LORazepam (ATIVAN) 1 MG tablet, Take 1 mg by mouth 2 (two) times daily as needed., Disp: , Rfl:  ?  losartan-hydrochlorothiazide (HYZAAR) 100-25 MG tablet, Take 1 tablet by mouth daily., Disp: ,  Rfl: 2 ?  metFORMIN (GLUCOPHAGE-XR) 500 MG 24 hr tablet, Take 500 mg by mouth 2 (two) times daily., Disp: , Rfl:  ?  metoprolol tartrate (LOPRESSOR) 25 MG tablet, Take 25 mg by mouth 2 (two) times daily., Disp: , Rfl: 3 ?  ondansetron (ZOFRAN) 4 MG tablet, Take 1 tablet (4 mg total) by mouth every 8 (eight) hours as needed., Disp: 20 tablet, Rfl: 0 ?  ondansetron (ZOFRAN) 4 MG tablet, Take 1 tablet (4 mg total) by mouth every 8 (eight) hours as needed., Disp: 20 tablet, Rfl: 0 ?  potassium chloride (MICRO-K) 10 MEQ CR capsule, Take 10 mEq by mouth  daily., Disp: , Rfl: 3 ?  SIMBRINZA 1-0.2 % SUSP, Apply 1 drop to eye 2 (two) times daily., Disp: , Rfl:  ?  simvastatin (ZOCOR) 40 MG tablet, Take 1 tablet by mouth at bedtime., Disp: , Rfl:  ?  valsartan-hydrochlorothiazide (DIOVAN-HCT) 160-12.5 MG tablet, Take 1 tablet by mouth daily., Disp: , Rfl:  ? ?PAST MEDICAL HISTORY: ?Past Medical History:  ?Diagnosis Date  ? Diabetes mellitus without complication ([place])   ? Hypertension   ? ? ?PAST SURGICAL HISTORY: ?Past Surgical History:  ?Procedure Laterality Date  ? APPENDECTOMY    ? FOOT SURGERY Left   ? 2 toe removed  ? ? ?FAMILY HISTORY: ?Family History  ?Problem Relation Age of Onset  ? Heart Problems Mother   ? High blood pressure Mother   ? High blood pressure Father   ? Atrial fibrillation Sister   ? ? ?SOCIAL HISTORY: ? ?Social History  ? ?Socioeconomic History  ? Marital status: Widowed  ?  Spouse name: Not on file  ? Number of children: 2  ? Years of education: Not on file  ? Highest education level: High school graduate  ?Occupational History  ? Not on file  ?Tobacco Use  ? Smoking status: Former  ?  Packs/day: 0.25  ?  Years: 15.00  ?  Pack years: 3.75  ?  Types: Cigarettes  ?  Quit date: 1989  ?  Years since quitting: 34.3  ? Smokeless tobacco: Never  ?Substance and Sexual Activity  ? Alcohol use: Never  ? Drug use: Never  ? Sexual activity: Not on file  ?Other Topics Concern  ? Not on file  ?Social History Narrative  ? Lives alone  ? R handed  ? Caffeine:rare  ? ?Social Determinants of Health  ? ?Financial Resource Strain: Not on file  ?Food Insecurity: Not on file  ?Transportation Needs: Not on file  ?Physical Activity: Not on file  ?Stress: Not on file  ?Social Connections: Not on file  ?Intimate Partner Violence: Not on file  ? ? ? ?PHYSICAL EXAM ? ?Vitals:  ? 05/20/21 1034  ?BP: (!) 126/55  ?Pulse: 62  ?Height: _0  (1.626 m)  ? ? ?Body mass index is 26.28 kg/m?. ? ? ?General: The patient is well-developed and well-nourished and in no acute distress.   No bradykinesia. ? ?HEENT:  Head is [place]/AT.  Sclera are anicteric.  ? ?Neck: No carotid bruits are noted.  The neck is nontender. ? ?Cardiovascular: The heart has a regular rate and rhythm with a normal S1 and S2. There were no murmurs, gallops or rubs.   ? ?Skin: Extremities are without rash or  edema. ? ?Musculoskeletal:  Back is nontender ? ?Neurologic Exam ? ?Mental status: The patient is alert and oriented x 3 at the time of the examination. The patient has mildly reduced short-term  recall (2/3) numbers on the clock were mildly misplaced in the hands were mildly misplaced.  Focus was mildly reduced   speech is normal. ? ?Cranial nerves: Extraocular movements are full. Pupils are equal, round, and reactive to light and accomodation.  Visual fields are full.  Facial symmetry is present. There is good facial sensation to soft touch bilaterally.Facial strength is normal.  Trapezius and sternocleidomastoid strength is normal. No dysarthria is noted.  The tongue is midline, and the patient has symmetric elevation of the soft palate. No obvious hearing deficits are noted. ? ?Motor: She has a fine tremor in the chin but not in the hands.  Muscle bulk is normal.   Tone is normal. Strength is  5 / 5 in all 4 extremities.  ? ?Sensory: Sensory testing is intact to pinprick, soft touch and vibration sensation in the arms.  She has mildly reduced vibration sensation in the toes but normal at the ankles.  She has reduced pinprick and temperature sensation to above the ankles.  And markedly reduced in the feet ? ?Coordination: Cerebellar testing reveals good finger-nose-finger and heel-to-shin bilaterally. ? ?Gait and station: Station is normal.   Her gait has a mildly reduced stride.  She has retropulsion.  Romberg is negative.  ? ?Reflexes: Deep tendon reflexes are symmetric and normal in arms, mildly increased at the knees and reduced at the ankles.   Plantar responses are flexor. ? ? ? ? ? ?ASSESSMENT AND  PLAN ? ?Polyneuropathy - Plan: Vitamin B12, TSH, Rheumatoid factor, Sjogren's syndrome antibods(ssa + ssb), Multiple Myeloma Panel (SPEP&IFE w/QIG) ? ?Gait disturbance - Plan: MR CERVICAL SPINE WO CONTRAST ? ?Memory loss -

## 2021-05-20 ENCOUNTER — Ambulatory Visit (INDEPENDENT_AMBULATORY_CARE_PROVIDER_SITE_OTHER): Payer: Medicare Other | Admitting: Neurology

## 2021-05-20 ENCOUNTER — Encounter: Payer: Self-pay | Admitting: Neurology

## 2021-05-20 ENCOUNTER — Telehealth: Payer: Self-pay | Admitting: Neurology

## 2021-05-20 VITALS — BP 126/55 | HR 62 | Ht 64.0 in

## 2021-05-20 DIAGNOSIS — R269 Unspecified abnormalities of gait and mobility: Secondary | ICD-10-CM

## 2021-05-20 DIAGNOSIS — R413 Other amnesia: Secondary | ICD-10-CM | POA: Diagnosis not present

## 2021-05-20 DIAGNOSIS — G629 Polyneuropathy, unspecified: Secondary | ICD-10-CM

## 2021-05-20 NOTE — Telephone Encounter (Signed)
Medicare/bcbs supp order sent to GI. NPR they will reach out to the patient to schedule.  ?

## 2021-05-21 ENCOUNTER — Ambulatory Visit: Payer: Medicare Other | Admitting: Physical Therapy

## 2021-05-21 ENCOUNTER — Encounter: Payer: Self-pay | Admitting: Physical Therapy

## 2021-05-21 DIAGNOSIS — R293 Abnormal posture: Secondary | ICD-10-CM

## 2021-05-21 DIAGNOSIS — R2689 Other abnormalities of gait and mobility: Secondary | ICD-10-CM

## 2021-05-21 DIAGNOSIS — R2681 Unsteadiness on feet: Secondary | ICD-10-CM | POA: Diagnosis not present

## 2021-05-21 DIAGNOSIS — M79675 Pain in left toe(s): Secondary | ICD-10-CM

## 2021-05-21 DIAGNOSIS — M6281 Muscle weakness (generalized): Secondary | ICD-10-CM

## 2021-05-21 NOTE — Therapy (Signed)
[place] ?[place] ?[address] ?[place], [place], [address] ?Phone: [phone]   Fax:  [phone] ? ?Physical Therapy Treatment ? ?Patient Details  ?Name: Debra Hudson ?MRN: [id number] ?Date of Birth: 1935/10/11 ?Referring Provider (PT): Lavone Orn, MD ? ? ?Encounter Date: 05/21/2021 ? ? PT End of Session - 05/21/21 1553   ? ? Visit Number 2   ? Number of Visits 20   ? Date for PT Re-Evaluation 07/23/21   ? PT Start Time 4259   ? PT Stop Time 5638   ? PT Time Calculation (min) 40 min   ? Activity Tolerance Patient tolerated treatment well   ? Behavior During Therapy [facility] for tasks assessed/performed   ? ?  ?  ? ?  ? ? ?Past Medical History:  ?Diagnosis Date  ? Diabetes mellitus without complication ([place])   ? Hypertension   ? ? ?Past Surgical History:  ?Procedure Laterality Date  ? APPENDECTOMY    ? FOOT SURGERY Left   ? 2 toe removed  ? ? ?There were no vitals filed for this visit. ? ? Subjective Assessment - 05/21/21 1511   ? ? Subjective I wish I could say I've been better but I'm miserable being by myself due to loss of husband. I'm going to the doctor to get an MRI on my neck. Needs to work on balance w/ walking.   ? Pertinent History L second toe amputation.   ? Limitations Walking;House hold activities;Standing   ? Patient Stated Goals Improved balance, walk without the cane, if possible, walk further without fatigue.   ? Currently in Pain? No/denies   ? Pain Score 0-No pain   ? ?  ?  ? ?  ? ? ? ? ? ? ? ? ? ? ? ? ? ? ? ? ? ? ? ? [place] Adult PT Treatment/Exercise - 05/21/21 0001   ? ?  ? Ambulation/Gait  ? Gait Comments lap inside building to assess stability w/ SPC.   ?  ? Exercises  ? Exercises Lumbar;Knee/Hip   ?  ? Lumbar Exercises: Standing  ? Other Standing Lumbar Exercises alternating step taps 2x10 B LE, CGA.   1 set w/ SPC, 2nd set no AD.LOB x2 w/ no AD.  ?  ? Lumbar Exercises: Seated  ? Sit to Stand 10 reps   ? Sit to Stand Limitations UE's on knees;  VC's needed for correct posture.   ?  ? Knee/Hip Exercises: Machines for Strengthening  ? Cybex Knee Extension #5 2x10   ? Cybex Knee Flexion 20# 2x10   ?  ? Knee/Hip Exercises: Standing  ? Forward Step Up Both;10 reps;1 set;Hand Hold: 1;Step Height: 4"   ? Other Standing Knee Exercises lateral side stepping across mat table x4, no AD, CGA; lateral step to over cane x10 both sides, no AD, CGA.   ? Other Standing Knee Exercises fwd step to over 3 canes w/o AD x4 B LE; fwd step through 3 canes w/o AD x4 B LE, LOB x1.   ?  ? Ankle Exercises: Aerobic  ? Nustep lvl 4 x6 mins   ? ?  ?  ? ?  ? ? ? ? ? ? ? ? ? ? ? ? PT Short Term Goals - 05/14/21 1444   ? ?  ? PT SHORT TERM GOAL #1  ? Title Pt will be independent with initial HEP   ? Time 4   ? Period Weeks   ?  Status New   ? Target Date 06/11/21   ? ?  ?  ? ?  ? ? ? ? PT Long Term Goals - 05/14/21 1444   ? ?  ? PT LONG TERM GOAL #1  ? Title I with final HEP   ? Time 10   ? Period Weeks   ? Status New   ? Target Date 07/23/21   ?  ? PT LONG TERM GOAL #2  ? Title Pt will decrease 5xSTS time to 12 seconds or less, in order to demonstrate improved LE strength and power production.   ? Baseline 23.59   ? Time 10   ? Period Weeks   ? Status New   ? Target Date 07/23/21   ?  ? PT LONG TERM GOAL #3  ? Title Patient will score at least 45 on BERG to demonstrate improved balance.   ? Baseline TBD   ? Time 10   ? Period Weeks   ? Status New   ? Target Date 07/23/21   ?  ? PT LONG TERM GOAL #4  ? Title Pt will decrease TUG time with LRAD to 12s or less, in order to demonstrate improved gait speed and decreased fall fisk.   ? Baseline 19.57   ? Time 10   ? Period Weeks   ? Status New   ? Target Date 07/23/21   ? ?  ?  ? ?  ? ? ? ? ? ? ? ? Plan - 05/21/21 1555   ? ? Clinical Impression Statement Pt tolerated an initial progression to TE well evident by no subjective reports of increase pain. Bilateral knee adduction noted with sit to stands but able to correct with cues. Some  LOB noted  when stepping up or stepping objects with LLE . Pt reports having a toe amputated on that foot. Cue needed to increase step length with side steps over dowel rod. Cues for eccentric control needed with leg extensions and curls.   ? Personal Factors and Comorbidities Age;Fitness   ? Examination-Activity Limitations Locomotion Level;Reach Overhead;Bend;Caring for Others;Squat;Stairs;Stand;Toileting;Dressing;Transfers   ? Examination-Participation Restrictions Community Activity;Cleaning   ? Stability/Clinical Decision Making Evolving/Moderate complexity   ? Clinical Decision Making Moderate   ? Rehab Potential Good   ? PT Frequency 2x / week   ? PT Duration Other (comment)   ? PT Treatment/Interventions ADLs/Self Care Home Management;Electrical Stimulation;Moist Heat;DME Instruction;Neuromuscular re-education;Balance training;Therapeutic exercise;Therapeutic activities;Patient/family education;Stair training;Gait training;Functional mobility training;Manual techniques;Energy conservation;Taping;Vestibular;Visual/perceptual remediation/compensation;Orthotic Fit/Training;Passive range of motion;Dry needling   ? PT Next Visit Plan Initiate/update HEP   ? ?  ?  ? ?  ? ? ?Patient will benefit from skilled therapeutic intervention in order to improve the following deficits and impairments:  Abnormal gait, Decreased coordination, Difficulty walking, Postural dysfunction, Decreased strength, Decreased mobility, Improper body mechanics, Impaired flexibility, Decreased balance, Pain, Impaired perceived functional ability, Decreased activity tolerance, Decreased endurance ? ?Visit Diagnosis: ?Abnormal posture ? ?Unsteadiness on feet ? ?Other abnormalities of gait and mobility ? ?Muscle weakness (generalized) ? ?Pain in left toe(s) ? ? ? ? ?Problem List ?Patient Active Problem List  ? Diagnosis Date Noted  ? Urinary urgency 08/22/2020  ? Anxiety 08/22/2020  ? Atrophic vaginitis 08/22/2020  ? Dysuria 08/22/2020  ? Female stress  incontinence 08/22/2020  ? Closed fracture of right side of maxilla with routine healing 05/21/2020  ? ? ?Scot Jun, PTA ?05/21/2021, 4:01 PM ? ?[place] ?[place] ?[address] [place]  [place], [place], [address] ?Phone: [phone]   Fax:  [phone] ? ?Name: Debra Hudson ?MRN: [id number] ?Date of Birth: [birth year] ? ? ? ?

## 2021-05-23 LAB — VITAMIN B12: Vitamin B-12: 883 pg/mL (ref 232–1245)

## 2021-05-23 LAB — MULTIPLE MYELOMA PANEL, SERUM
Albumin SerPl Elph-Mcnc: 3.9 g/dL (ref 2.9–4.4)
Albumin/Glob SerPl: 1.4 (ref 0.7–1.7)
Alpha 1: 0.2 g/dL (ref 0.0–0.4)
Alpha2 Glob SerPl Elph-Mcnc: 0.8 g/dL (ref 0.4–1.0)
B-Globulin SerPl Elph-Mcnc: 1.2 g/dL (ref 0.7–1.3)
Gamma Glob SerPl Elph-Mcnc: 0.6 g/dL (ref 0.4–1.8)
Globulin, Total: 2.8 g/dL (ref 2.2–3.9)
IgA/Immunoglobulin A, Serum: 277 mg/dL (ref 64–422)
IgG (Immunoglobin G), Serum: 856 mg/dL (ref 586–1602)
IgM (Immunoglobulin M), Srm: 23 mg/dL — ABNORMAL LOW (ref 26–217)
Total Protein: 6.7 g/dL (ref 6.0–8.5)

## 2021-05-23 LAB — TSH: TSH: 0.756 u[IU]/mL (ref 0.450–4.500)

## 2021-05-23 LAB — SJOGREN'S SYNDROME ANTIBODS(SSA + SSB)
ENA SSA (RO) Ab: <0.2 AI (ref 0.0–0.9)
ENA SSB (LA) Ab: <0.2 AI (ref 0.0–0.9)

## 2021-05-23 LAB — RHEUMATOID FACTOR: Rheumatoid fact SerPl-aCnc: <10 IU/mL (ref <14.0)

## 2021-05-27 DIAGNOSIS — Z20822 Contact with and (suspected) exposure to covid-19: Secondary | ICD-10-CM | POA: Diagnosis not present

## 2021-05-29 ENCOUNTER — Ambulatory Visit: Payer: Medicare Other | Admitting: Physical Therapy

## 2021-05-31 ENCOUNTER — Other Ambulatory Visit: Payer: Medicare Other

## 2021-06-02 ENCOUNTER — Encounter: Payer: Self-pay | Admitting: Physical Therapy

## 2021-06-02 ENCOUNTER — Ambulatory Visit: Payer: Medicare Other | Attending: Internal Medicine | Admitting: Physical Therapy

## 2021-06-02 DIAGNOSIS — R2681 Unsteadiness on feet: Secondary | ICD-10-CM | POA: Diagnosis not present

## 2021-06-02 DIAGNOSIS — Z20822 Contact with and (suspected) exposure to covid-19: Secondary | ICD-10-CM | POA: Diagnosis not present

## 2021-06-02 DIAGNOSIS — R2689 Other abnormalities of gait and mobility: Secondary | ICD-10-CM | POA: Diagnosis not present

## 2021-06-02 DIAGNOSIS — R293 Abnormal posture: Secondary | ICD-10-CM | POA: Diagnosis not present

## 2021-06-02 DIAGNOSIS — M79675 Pain in left toe(s): Secondary | ICD-10-CM | POA: Diagnosis not present

## 2021-06-02 DIAGNOSIS — M6281 Muscle weakness (generalized): Secondary | ICD-10-CM | POA: Insufficient documentation

## 2021-06-02 NOTE — Therapy (Signed)
[facility] ?[place] ?[address] ?[place], [place], [address] ?Phone: [phone]   Fax:  [phone] ? ?Physical Therapy Treatment ? ?Patient Details  ?Name: Debra Hudson ?MRN: [id number] ?Date of Birth: 1936-01-18 ?Referring Provider (PT): Lavone Orn, MD ? ? ?Encounter Date: 06/02/2021 ? ? PT End of Session - 06/02/21 1441   ? ? Visit Number 3   ? Number of Visits 20   ? Date for PT Re-Evaluation 07/23/21   ? PT Start Time 1345   ? PT Stop Time 1884   ? PT Time Calculation (min) 46 min   ? Activity Tolerance Patient tolerated treatment well   ? Behavior During Therapy [facility] for tasks assessed/performed   ? ?  ?  ? ?  ? ? ?Past Medical History:  ?Diagnosis Date  ? Diabetes mellitus without complication ([place])   ? Hypertension   ? ? ?Past Surgical History:  ?Procedure Laterality Date  ? APPENDECTOMY    ? FOOT SURGERY Left   ? 2 toe removed  ? ? ?There were no vitals filed for this visit. ? ? Subjective Assessment - 06/02/21 1346   ? ? Subjective I'm doing good, I'm going to the doctor soon for an MRI on my neck it's been bothering me.   ? Pertinent History L second toe amputation.   ? Limitations Walking;House hold activities;Standing   ? Diagnostic tests Cervical Spine XR: 1. No acute intracranial process.  2. Minimally displaced fractures of the bilateral nasal bones, with  overlying soft tissue swelling.  3. No acute cervical spine fracture. Extensive multilevel  spondylosis and facet hypertrophy; CT Head: negative for intracranial abnormality   ? Patient Stated Goals Improved balance, walk without the cane, if possible, walk further without fatigue.   ? Currently in Pain? No/denies   ? ?  ?  ? ?  ? ? ? ? ? ? ? ? ? ? ? ? ? ? ? ? ? ? ? ? [place] Adult PT Treatment/Exercise - 06/02/21 0001   ? ?  ? Lumbar Exercises: Aerobic  ? Nustep lvl 5 x 6 mins   ?  ? Lumbar Exercises: Standing  ? Other Standing Lumbar Exercises rows and ext w/ red TB 2x10   ?  ? Knee/Hip Exercises:  Standing  ? Forward Step Up Both;1 set;10 reps   ? Forward Step Up Limitations 6' in parellel bars   ? Other Standing Knee Exercises alternating step taps on cone 2x10 w/ SPC   ? Other Standing Knee Exercises tandem stance 30 secs x3 B LE; tandem walking x2 on balance beam CGA; tandem walking fwd and backwards x4; lateral walking in parellel bars x3 both sides; standing on air ex w/ ball touches 30 secs x 3   ?  ? Knee/Hip Exercises: Seated  ? Sit to Sand 2 sets;10 reps   ? ?  ?  ? ?  ? ? ? ? ? ? ? ? ? ? ? ? PT Short Term Goals - 05/14/21 1444   ? ?  ? PT SHORT TERM GOAL #1  ? Title Pt will be independent with initial HEP   ? Time 4   ? Period Weeks   ? Status New   ? Target Date 06/11/21   ? ?  ?  ? ?  ? ? ? ? PT Long Term Goals - 05/14/21 1444   ? ?  ? PT LONG TERM GOAL #1  ? Title I with  final HEP   ? Time 10   ? Period Weeks   ? Status New   ? Target Date 07/23/21   ?  ? PT LONG TERM GOAL #2  ? Title Pt will decrease 5xSTS time to 12 seconds or less, in order to demonstrate improved LE strength and power production.   ? Baseline 23.59   ? Time 10   ? Period Weeks   ? Status New   ? Target Date 07/23/21   ?  ? PT LONG TERM GOAL #3  ? Title Patient will score at least 45 on BERG to demonstrate improved balance.   ? Baseline TBD   ? Time 10   ? Period Weeks   ? Status New   ? Target Date 07/23/21   ?  ? PT LONG TERM GOAL #4  ? Title Pt will decrease TUG time with LRAD to 12s or less, in order to demonstrate improved gait speed and decreased fall fisk.   ? Baseline 19.57   ? Time 10   ? Period Weeks   ? Status New   ? Target Date 07/23/21   ? ?  ?  ? ?  ? ? ? ? ? ? ? ? Plan - 06/02/21 1442   ? ? Clinical Impression Statement Patient came in doing well. Some LOB noted during toe taps on cone, standing on airex w/ ball taps, tandem stance, and tandem walking. Attempted tandem walking on balance beam but unable due to balance. VC's and tactile cues needed for correct posture during rows and shoulder ext.   ? Personal  Factors and Comorbidities Age;Fitness   ? Examination-Activity Limitations Locomotion Level;Reach Overhead;Bend;Caring for Others;Squat;Stairs;Stand;Toileting;Dressing;Transfers   ? Examination-Participation Restrictions Community Activity;Cleaning   ? Stability/Clinical Decision Making Evolving/Moderate complexity   ? Clinical Decision Making Moderate   ? Rehab Potential Good   ? PT Frequency 2x / week   ? PT Duration Other (comment)   ? PT Treatment/Interventions ADLs/Self Care Home Management;Electrical Stimulation;Moist Heat;DME Instruction;Neuromuscular re-education;Balance training;Therapeutic exercise;Therapeutic activities;Patient/family education;Stair training;Gait training;Functional mobility training;Manual techniques;Energy conservation;Taping;Vestibular;Visual/perceptual remediation/compensation;Orthotic Fit/Training;Passive range of motion;Dry needling   ? PT Next Visit Plan Initiate/update HEP   ? Consulted and Agree with Plan of Care Patient   ? ?  ?  ? ?  ? ? ?Patient will benefit from skilled therapeutic intervention in order to improve the following deficits and impairments:  Abnormal gait, Decreased coordination, Difficulty walking, Postural dysfunction, Decreased strength, Decreased mobility, Improper body mechanics, Impaired flexibility, Decreased balance, Pain, Impaired perceived functional ability, Decreased activity tolerance, Decreased endurance ? ?Visit Diagnosis: ?Abnormal posture ? ?Unsteadiness on feet ? ?Other abnormalities of gait and mobility ? ?Muscle weakness (generalized) ? ? ? ? ?Problem List ?Patient Active Problem List  ? Diagnosis Date Noted  ? Urinary urgency 08/22/2020  ? Anxiety 08/22/2020  ? Atrophic vaginitis 08/22/2020  ? Dysuria 08/22/2020  ? Female stress incontinence 08/22/2020  ? Closed fracture of right side of maxilla with routine healing 05/21/2020  ? ? ?Debra Hudson ?06/02/2021, 2:50 PM ? ?[place] ?[place] ?[address] ?[place], [place], [address] ?Phone: [phone]   Fax:  [phone] ? ?Name: Debra Hudson ?MRN: [id number] ?Date of Birth: Nov 05, 1935 ? ? ? ?

## 2021-06-03 DIAGNOSIS — Z20822 Contact with and (suspected) exposure to covid-19: Secondary | ICD-10-CM | POA: Diagnosis not present

## 2021-06-04 ENCOUNTER — Ambulatory Visit: Payer: Medicare Other | Admitting: Physical Therapy

## 2021-06-04 DIAGNOSIS — Z1231 Encounter for screening mammogram for malignant neoplasm of breast: Secondary | ICD-10-CM | POA: Diagnosis not present

## 2021-06-04 DIAGNOSIS — Z01419 Encounter for gynecological examination (general) (routine) without abnormal findings: Secondary | ICD-10-CM | POA: Diagnosis not present

## 2021-06-09 ENCOUNTER — Encounter: Payer: Self-pay | Admitting: Physical Therapy

## 2021-06-09 ENCOUNTER — Ambulatory Visit: Payer: Medicare Other | Admitting: Physical Therapy

## 2021-06-09 DIAGNOSIS — R2681 Unsteadiness on feet: Secondary | ICD-10-CM

## 2021-06-09 DIAGNOSIS — Z20822 Contact with and (suspected) exposure to covid-19: Secondary | ICD-10-CM | POA: Diagnosis not present

## 2021-06-09 DIAGNOSIS — R2689 Other abnormalities of gait and mobility: Secondary | ICD-10-CM | POA: Diagnosis not present

## 2021-06-09 DIAGNOSIS — M6281 Muscle weakness (generalized): Secondary | ICD-10-CM | POA: Diagnosis not present

## 2021-06-09 DIAGNOSIS — R293 Abnormal posture: Secondary | ICD-10-CM

## 2021-06-09 DIAGNOSIS — M79675 Pain in left toe(s): Secondary | ICD-10-CM | POA: Diagnosis not present

## 2021-06-09 NOTE — Therapy (Signed)
[place] [facility] ?[place], [place], [address] ?Phone: [phone]   Fax:  [phone] ? ?Physical Therapy Treatment ? ?Patient Details  ?Name: Debra Hudson ?MRN: [id number] ?Date of Birth: Jul 05, 1935 ?Referring Provider (PT): Lavone Orn, MD ? ? ?Encounter Date: 06/09/2021 ? ? PT End of Session - 06/09/21 1342   ? ? Visit Number 4   ? Date for PT Re-Evaluation 07/23/21   ? PT Start Time 1300   ? PT Stop Time 3903   ? PT Time Calculation (min) 45 min   ? Activity Tolerance Patient tolerated treatment well   ? Behavior During Therapy [facility] for tasks assessed/performed   ? ?  ?  ? ?  ? ? ?Past Medical History:  ?Diagnosis Date  ? Diabetes mellitus without complication ([place])   ? Hypertension   ? ? ?Past Surgical History:  ?Procedure Laterality Date  ? APPENDECTOMY    ? FOOT SURGERY Left   ? 2 toe removed  ? ? ?There were no vitals filed for this visit. ? ? Subjective Assessment - 06/09/21 1302   ? ? Subjective Doing ok but not great   ? Currently in Pain? No/denies   ? ?  ?  ? ?  ? ? ? ? ? ? ? ? ? ? ? ? ? ? ? ? ? ? ? ? [place] Adult PT Treatment/Exercise - 06/09/21 0001   ? ?  ? High Level Balance  ? High Level Balance Activities Side stepping   over WaTE bar  ?  ? Lumbar Exercises: Aerobic  ? Nustep lvl 5 x 6 mins   ?  ? Lumbar Exercises: Standing  ? Other Standing Lumbar Exercises rows and ext w/ green TB 2x10   ?  ? Knee/Hip Exercises: Machines for Strengthening  ? Cybex Knee Extension #5 2x10   ? Cybex Knee Flexion 20# 2x12   ?  ? Knee/Hip Exercises: Standing  ? Lateral Step Up Both;2 sets;5 reps;Hand Hold: 0;Step Height: 4"   ? Forward Step Up Both;2 sets;5 reps;Hand Hold: 1;Step Height: 4"   ? Walking with Sports Cord 20lb 4 way x3 each   ? ?  ?  ? ?  ? ? ? ? ? ? ? ? ? ? ? ? PT Short Term Goals - 06/09/21 1342   ? ?  ? PT SHORT TERM GOAL #1  ? Title Pt will be independent with initial HEP   ? Status Achieved   ? ?  ?  ? ?  ? ? ? ? PT Long Term Goals -  06/09/21 1342   ? ?  ? PT LONG TERM GOAL #1  ? Title I with final HEP   ? Status Partially Met   ?  ? PT LONG TERM GOAL #2  ? Title Pt will decrease 5xSTS time to 12 seconds or less, in order to demonstrate improved LE strength and power production.   ? Status On-going   ?  ? PT LONG TERM GOAL #3  ? Title Patient will score at least 45 on BERG to demonstrate improved balance.   ? Status On-going   ?  ? PT LONG TERM GOAL #4  ? Title Pt will decrease TUG time with LRAD to 12s or less, in order to demonstrate improved gait speed and decreased fall fisk.   ? Status On-going   ? ?  ?  ? ?  ? ? ? ? ? ? ? ?  Plan - 06/09/21 1343   ? ? Clinical Impression Statement Pt enters clinic feeling well. She did well progressing with resisted gait, but required cues to control the eccentric phase. Some LOB present with lateral step ups. Cues for full ROM provided to with seated leg extensions. Cue to take bigger step needed with side step over WaTE bar.   ? Personal Factors and Comorbidities Age;Fitness   ? Examination-Activity Limitations Locomotion Level;Reach Overhead;Bend;Caring for Others;Squat;Stairs;Stand;Toileting;Dressing;Transfers   ? Examination-Participation Restrictions Community Activity;Cleaning   ? Stability/Clinical Decision Making Evolving/Moderate complexity   ? Rehab Potential Good   ? PT Frequency 2x / week   ? PT Treatment/Interventions ADLs/Self Care Home Management;Electrical Stimulation;Moist Heat;DME Instruction;Neuromuscular re-education;Balance training;Therapeutic exercise;Therapeutic activities;Patient/family education;Stair training;Gait training;Functional mobility training;Manual techniques;Energy conservation;Taping;Vestibular;Visual/perceptual remediation/compensation;Orthotic Fit/Training;Passive range of motion;Dry needling   ? PT Next Visit Plan BERG, TUG   ? ?  ?  ? ?  ? ? ?Patient will benefit from skilled therapeutic intervention in order to improve the following deficits and impairments:   Abnormal gait, Decreased coordination, Difficulty walking, Postural dysfunction, Decreased strength, Decreased mobility, Improper body mechanics, Impaired flexibility, Decreased balance, Pain, Impaired perceived functional ability, Decreased activity tolerance, Decreased endurance ? ?Visit Diagnosis: ?Abnormal posture ? ?Unsteadiness on feet ? ?Other abnormalities of gait and mobility ? ?Muscle weakness (generalized) ? ? ? ? ?Problem List ?Patient Active Problem List  ? Diagnosis Date Noted  ? Urinary urgency 08/22/2020  ? Anxiety 08/22/2020  ? Atrophic vaginitis 08/22/2020  ? Dysuria 08/22/2020  ? Female stress incontinence 08/22/2020  ? Closed fracture of right side of maxilla with routine healing 05/21/2020  ? ? ?Scot Jun, PTA ?06/09/2021, 1:46 PM ? ?[place] ?[address] ?[address] ?[place], [place], [address] ?Phone: [phone]   Fax:  [phone] ? ?Name: Debra Hudson ?MRN: [id number] ?Date of Birth: 1936/01/23 ? ? ? ?

## 2021-06-11 ENCOUNTER — Ambulatory Visit: Payer: Medicare Other | Admitting: Physical Therapy

## 2021-06-11 ENCOUNTER — Encounter: Payer: Self-pay | Admitting: Physical Therapy

## 2021-06-11 DIAGNOSIS — R293 Abnormal posture: Secondary | ICD-10-CM | POA: Diagnosis not present

## 2021-06-11 DIAGNOSIS — M6281 Muscle weakness (generalized): Secondary | ICD-10-CM

## 2021-06-11 DIAGNOSIS — R2681 Unsteadiness on feet: Secondary | ICD-10-CM | POA: Diagnosis not present

## 2021-06-11 DIAGNOSIS — R2689 Other abnormalities of gait and mobility: Secondary | ICD-10-CM | POA: Diagnosis not present

## 2021-06-11 DIAGNOSIS — M79675 Pain in left toe(s): Secondary | ICD-10-CM | POA: Diagnosis not present

## 2021-06-11 NOTE — Therapy (Signed)
[place] ?[address] ?[address] ?[place], [place], [address] ?Phone: [phone]   Fax:  [phone] ? ?Physical Therapy Treatment ? ?Patient Details  ?Name: Debra Hudson ?MRN: [id number] ?Date of Birth: October 22, 1935 ?Referring Provider (PT): Lavone Orn, MD ? ? ?Encounter Date: 06/11/2021 ? ? PT End of Session - 06/11/21 1340   ? ? Visit Number 5   ? Date for PT Re-Evaluation 07/23/21   ? PT Start Time 1300   ? PT Stop Time 1342   ? PT Time Calculation (min) 42 min   ? Activity Tolerance Patient tolerated treatment well   ? Behavior During Therapy [facility] for tasks assessed/performed   ? ?  ?  ? ?  ? ? ?Past Medical History:  ?Diagnosis Date  ? Diabetes mellitus without complication ([place])   ? Hypertension   ? ? ?Past Surgical History:  ?Procedure Laterality Date  ? APPENDECTOMY    ? FOOT SURGERY Left   ? 2 toe removed  ? ? ?There were no vitals filed for this visit. ? ? Subjective Assessment - 06/11/21 1259   ? ? Subjective Doing ok   ? Diagnostic tests Cervical Spine XR: 1. No acute intracranial process.  2. Minimally displaced fractures of the bilateral nasal bones, with  overlying soft tissue swelling.  3. No acute cervical spine fracture. Extensive multilevel  spondylosis and facet hypertrophy; CT Head: negative for intracranial abnormality   ? Currently in Pain? Yes   ? Pain Score 1    ? Pain Location Knee   ? Pain Descriptors / Indicators --   feel it  ? ?  ?  ? ?  ? ? ? ? ? [facility] PT Assessment - 06/11/21 0001   ? ?  ? Berg Balance Test  ? Sit to Stand Able to stand without using hands and stabilize independently   ? Standing Unsupported Able to stand safely 2 minutes   ? Sitting with Back Unsupported but Feet Supported on Floor or Stool Able to sit safely and securely 2 minutes   ? Stand to Sit Sits safely with minimal use of hands   ? Transfers Able to transfer safely, definite need of hands   ? Standing Unsupported with Eyes Closed Able to stand 10 seconds with  supervision   ? Standing Unsupported with Feet Together Able to place feet together independently and stand for 1 minute with supervision   ? From Standing, Reach Forward with Outstretched Arm Can reach forward >12 cm safely (5")   ? From Standing Position, Pick up Object from [place] to pick up shoe safely and easily   ? From Standing Position, Turn to Look Behind Over each Shoulder Looks behind one side only/other side shows less weight shift   ? Turn 360 Degrees Able to turn 360 degrees safely one side only in 4 seconds or less   ? Standing Unsupported, Alternately Place Feet on Step/Stool Able to complete 4 steps without aid or supervision   ? Standing Unsupported, One Foot in Front Able to take small step independently and hold 30 seconds   ? Standing on One Leg Tries to lift leg/unable to hold 3 seconds but remains standing independently   ? Total Score 43   ?  ? Timed Up and Go Test  ? Normal TUG (seconds) 16.9   without SPC, 15.27 with SPC  ? ?  ?  ? ?  ? ? ? ? ? ? ? ? ? ? ? ? ? ? ? ?  [place] Adult PT Treatment/Exercise - 06/11/21 0001   ? ?  ? Lumbar Exercises: Aerobic  ? Nustep lvl 5 x 6 mins   ?  ? Knee/Hip Exercises: Machines for Strengthening  ? Cybex Knee Extension #5 2x10   ? Cybex Knee Flexion 20# 2x12   ?  ? Knee/Hip Exercises: Standing  ? Other Standing Knee Exercises alternating step taps on cone 2x10 w/ SPC   ?  ? Knee/Hip Exercises: Seated  ? Sit to Sand 10 reps;without UE support   ? ?  ?  ? ?  ? ? ? ? ? ? ? ? ? ? ? ? PT Short Term Goals - 06/09/21 1342   ? ?  ? PT SHORT TERM GOAL #1  ? Title Pt will be independent with initial HEP   ? Status Achieved   ? ?  ?  ? ?  ? ? ? ? PT Long Term Goals - 06/11/21 1309   ? ?  ? PT LONG TERM GOAL #2  ? Title Pt will decrease 5xSTS time to 12 seconds or less, in order to demonstrate improved LE strength and power production.   ? Status Partially Met   14.90  ?  ? PT LONG TERM GOAL #3  ? Title Patient will score at least 45 on BERG to demonstrate improved  balance.   ? Status Partially Met   43/56  ?  ? PT LONG TERM GOAL #4  ? Title Pt will decrease TUG time with LRAD to 12s or less, in order to demonstrate improved gait speed and decreased fall fisk.   ? Status Partially Met   ? ?  ?  ? ?  ? ? ? ? ? ? ? ? Plan - 06/11/21 1342   ? ? Clinical Impression Statement Pt did well and progressed towards LTG. Pt has decreased her TUG and five times sit to stand times. Pt has also improved with her BERG balance test. Pt has most difficulty with tandem and SLS.  Instability noted with cone taps. Cue needed to control the eccentric phase of hamstring curls.   ? Personal Factors and Comorbidities Age;Fitness   ? Examination-Activity Limitations Locomotion Level;Reach Overhead;Bend;Caring for Others;Squat;Stairs;Stand;Toileting;Dressing;Transfers   ? Examination-Participation Restrictions Community Activity;Cleaning   ? Stability/Clinical Decision Making Evolving/Moderate complexity   ? Rehab Potential Good   ? PT Frequency 2x / week   ? PT Duration Other (comment)   ? PT Treatment/Interventions ADLs/Self Care Home Management;Electrical Stimulation;Moist Heat;DME Instruction;Neuromuscular re-education;Balance training;Therapeutic exercise;Therapeutic activities;Patient/family education;Stair training;Gait training;Functional mobility training;Manual techniques;Energy conservation;Taping;Vestibular;Visual/perceptual remediation/compensation;Orthotic Fit/Training;Passive range of motion;Dry needling   ? PT Next Visit Plan Balance interventions   ? ?  ?  ? ?  ? ? ?Patient will benefit from skilled therapeutic intervention in order to improve the following deficits and impairments:  Abnormal gait, Decreased coordination, Difficulty walking, Postural dysfunction, Decreased strength, Decreased mobility, Improper body mechanics, Impaired flexibility, Decreased balance, Pain, Impaired perceived functional ability, Decreased activity tolerance, Decreased endurance ? ?Visit  Diagnosis: ?Unsteadiness on feet ? ?Muscle weakness (generalized) ? ?Abnormal posture ? ? ? ? ?Problem List ?Patient Active Problem List  ? Diagnosis Date Noted  ? Urinary urgency 08/22/2020  ? Anxiety 08/22/2020  ? Atrophic vaginitis 08/22/2020  ? Dysuria 08/22/2020  ? Female stress incontinence 08/22/2020  ? Closed fracture of right side of maxilla with routine healing 05/21/2020  ? ? ?Scot Jun, PTA ?06/11/2021, 1:44 PM ? ?[place] ?[address] ?[address] [place]  [place], [place], [address] ?Phone: [phone]   Fax:  [phone] ? ?Name: SOJOURNER BEHRINGER ?MRN: [id number] ?Date of Birth: [birth year] ? ? ? ?

## 2021-06-12 DIAGNOSIS — Z20822 Contact with and (suspected) exposure to covid-19: Secondary | ICD-10-CM | POA: Diagnosis not present

## 2021-06-12 DIAGNOSIS — Z23 Encounter for immunization: Secondary | ICD-10-CM | POA: Diagnosis not present

## 2021-06-12 DIAGNOSIS — Z780 Asymptomatic menopausal state: Secondary | ICD-10-CM | POA: Diagnosis not present

## 2021-06-16 ENCOUNTER — Ambulatory Visit: Payer: Medicare Other | Admitting: Physical Therapy

## 2021-06-17 ENCOUNTER — Ambulatory Visit
Admission: RE | Admit: 2021-06-17 | Discharge: 2021-06-17 | Disposition: A | Discharge status: Home / Self Care | Payer: Medicare Other | Source: Ambulatory Visit | Attending: Neurology | Admitting: Neurology

## 2021-06-17 DIAGNOSIS — R269 Unspecified abnormalities of gait and mobility: Secondary | ICD-10-CM

## 2021-06-18 ENCOUNTER — Encounter: Payer: Self-pay | Admitting: Physical Therapy

## 2021-06-18 ENCOUNTER — Ambulatory Visit: Payer: Medicare Other | Admitting: Physical Therapy

## 2021-06-18 DIAGNOSIS — M79675 Pain in left toe(s): Secondary | ICD-10-CM

## 2021-06-18 DIAGNOSIS — R293 Abnormal posture: Secondary | ICD-10-CM

## 2021-06-18 DIAGNOSIS — R2681 Unsteadiness on feet: Secondary | ICD-10-CM

## 2021-06-18 DIAGNOSIS — R2689 Other abnormalities of gait and mobility: Secondary | ICD-10-CM | POA: Diagnosis not present

## 2021-06-18 DIAGNOSIS — M6281 Muscle weakness (generalized): Secondary | ICD-10-CM | POA: Diagnosis not present

## 2021-06-18 NOTE — Patient Instructions (Signed)
Access Code: [email or web address] ?URL: [email or web address] ?Date: 06/18/2021 ?Prepared by: Ethel Rana ? ?Exercises ?- Sit to Stand Without Arm Support  - 1 x daily - 7 x weekly - 2 sets - 10 reps ?- Heel Raise on Step  - 1 x daily - 7 x weekly - 2 sets - 10 reps ?- Standing 3-Way Leg Reach with Resistance at Ankles and Counter Support  - 1 x daily - 7 x weekly - 2 sets - 10 reps ?- Seated Thoracic Extension and Rotation with Reach  - 1 x daily - 7 x weekly - 3 sets - 2 reps ?

## 2021-06-18 NOTE — Therapy (Signed)
[place] ?[place] [address] ?[place], [place], [address] ?Phone: [phone]   Fax:  [phone] ? ?Physical Therapy Treatment ? ?Patient Details  ?Name: Debra Hudson ?MRN: [id number] ?Date of Birth: [birth year] ?Referring Provider (PT): Lavone Orn, MD ? ? ?Encounter Date: 06/18/2021 ? ? PT End of Session - 06/18/21 1405   ? ? Visit Number 6   ? Date for PT Re-Evaluation 07/23/21   ? PT Start Time 1319   ? PT Stop Time 6659   ? PT Time Calculation (min) 38 min   ? Activity Tolerance Patient tolerated treatment well   ? Behavior During Therapy [facility] for tasks assessed/performed   ? ?  ?  ? ?  ? ? ?Past Medical History:  ?Diagnosis Date  ? Diabetes mellitus without complication ([place])   ? Hypertension   ? ? ?Past Surgical History:  ?Procedure Laterality Date  ? APPENDECTOMY    ? FOOT SURGERY Left   ? 2 toe removed  ? ? ?There were no vitals filed for this visit. ? ? Subjective Assessment - 06/18/21 1321   ? ? Subjective Patient reports that she got a Covid booster and felt bad for a while, but it has improved. She had an  MRI of the cervical spine without contrast, showing the following:  1.   The spinal cord appears normal.    2.   At C5-C6, there is borderline spinal stenosis due to degenerative changes.  There is no nerve root compression.  3.   At C6-C7, there is mild spinal stenosis due to a small left foraminal disc herniation and other degenerative change.  There is potential for left C7 nerve root compression.  4.   At T2-T3, noted on the sagittal views but not covered on the axial views, there is a disc herniation though there does not appear to be spinal stenosis.  5.   Milder degenerative changes at the other levels.   ? Diagnostic tests Cervical Spine XR: 1. No acute intracranial process.  2. Minimally displaced fractures of the bilateral nasal bones, with  overlying soft tissue swelling.  3. No acute cervical spine fracture. Extensive multilevel   spondylosis and facet hypertrophy; CT Head: negative for intracranial abnormality   ? Currently in Pain? No/denies   ? ?  ?  ? ?  ? ? ? ? ? ? ? ? ? ? ? ? ? ? ? ? ? ? ? ? [place] Adult PT Treatment/Exercise - 06/18/21 0001   ? ?  ? Ambulation/Gait  ? Gait Comments Shaky, with cane, slightly improved stability after treatment with less shaking noted.   ?  ? Posture/Postural Control  ? Posture Comments Patient performed seated upright reach wiht arms, rotating trunk R/L for postural control, 2 x 2 reps.   ?  ? Lumbar Exercises: Aerobic  ? Nustep lvl 5 x 6 mins   ?  ? Lumbar Exercises: Standing  ? Heel Raises 20 reps   ? Heel Raises Limitations On step, BUE support   ?  ? Knee/Hip Exercises: Standing  ? Hip Flexion Stengthening;Both;1 set;10 reps   ? Hip Flexion Limitations Red Tband at knees   ? Hip Abduction Stengthening;Both;1 set;10 reps;Knee straight   ? Abduction Limitations Red Tband at knees   ? Hip Extension Stengthening;Both;1 set;10 reps   ? Extension Limitations Red Tband at knees   ?  ? Knee/Hip Exercises: Seated  ? Sit to Sand 1 set;10 reps;with UE support  Elevated seat by 2" to avoid B hip add during movement.  ? ?  ?  ? ?  ? ? ? ? ? ? ? ? ? ? PT Education - 06/18/21 1405   ? ? Education Details HEP   ? Person(s) Educated Patient   ? Methods Explanation;Demonstration;Handout   ? Comprehension Returned demonstration;Verbalized understanding   ? ?  ?  ? ?  ? ? ? PT Short Term Goals - 06/18/21 1408   ? ?  ? PT SHORT TERM GOAL #1  ? Title Pt will be independent with initial HEP   ? Baseline HEP initiated today.   ? Time 1   ? Period Weeks   ? Status On-going   ? Target Date 06/25/21   ? ?  ?  ? ?  ? ? ? ? PT Long Term Goals - 06/11/21 1309   ? ?  ? PT LONG TERM GOAL #2  ? Title Pt will decrease 5xSTS time to 12 seconds or less, in order to demonstrate improved LE strength and power production.   ? Status Partially Met   14.90  ?  ? PT LONG TERM GOAL #3  ? Title Patient will score at least 45 on BERG to  demonstrate improved balance.   ? Status Partially Met   43/56  ?  ? PT LONG TERM GOAL #4  ? Title Pt will decrease TUG time with LRAD to 12s or less, in order to demonstrate improved gait speed and decreased fall fisk.   ? Status Partially Met   ? ?  ?  ? ?  ? ? ? ? ? ? ? ? Plan - 06/18/21 1405   ? ? Clinical Impression Statement Patient reports she received a Covid Booster last week which caused her to feel poorly, still having some headaches since then, but not today. Treatment focused on strengthening for BLE as well as trunk, initiating a HEP. She tolerated exercises well, but required VC for form and demonstrates weakness througohut.   ? Personal Factors and Comorbidities Age;Fitness   ? Examination-Activity Limitations Locomotion Level;Reach Overhead;Bend;Caring for Others;Squat;Stairs;Stand;Toileting;Dressing;Transfers   ? Examination-Participation Restrictions Community Activity;Cleaning   ? Stability/Clinical Decision Making Evolving/Moderate complexity   ? Rehab Potential Good   ? PT Frequency 2x / week   ? PT Duration Other (comment)   ? PT Treatment/Interventions ADLs/Self Care Home Management;Electrical Stimulation;Moist Heat;DME Instruction;Neuromuscular re-education;Balance training;Therapeutic exercise;Therapeutic activities;Patient/family education;Stair training;Gait training;Functional mobility training;Manual techniques;Energy conservation;Taping;Vestibular;Visual/perceptual remediation/compensation;Orthotic Fit/Training;Passive range of motion;Dry needling   ? PT Next Visit Plan Assess toelrance to HEP, continue with strengtheing and balance training.   ? PT Home Exercise Plan [email or web address]   ? Consulted and Agree with Plan of Care Patient   ? ?  ?  ? ?  ? ? ?Patient will benefit from skilled therapeutic intervention in order to improve the following deficits and impairments:  Abnormal gait, Decreased coordination, Difficulty walking, Postural dysfunction, Decreased strength, Decreased mobility,  Improper body mechanics, Impaired flexibility, Decreased balance, Pain, Impaired perceived functional ability, Decreased activity tolerance, Decreased endurance ? ?Visit Diagnosis: ?Unsteadiness on feet ? ?Muscle weakness (generalized) ? ?Abnormal posture ? ?Other abnormalities of gait and mobility ? ?Pain in left toe(s) ? ? ? ? ?Problem List ?Patient Active Problem List  ? Diagnosis Date Noted  ? Urinary urgency 08/22/2020  ? Anxiety 08/22/2020  ? Atrophic vaginitis 08/22/2020  ? Dysuria 08/22/2020  ? Female stress incontinence 08/22/2020  ? Closed fracture of right side of maxilla with routine healing  05/21/2020  ? ? ?Marcelina Morel, DPT ?06/18/2021, 2:09 PM ? ?[place] ?[address] ?[address] ?[place], [place], [address] ?Phone: [phone]   Fax:  [phone] ? ?Name: RIFKA RAMEY ?MRN: [id number] ?Date of Birth: February 15, 1935 ? ? ? ?

## 2021-06-19 ENCOUNTER — Telehealth: Payer: Self-pay | Admitting: *Deleted

## 2021-06-19 NOTE — Telephone Encounter (Signed)
duplicate

## 2021-06-19 NOTE — Telephone Encounter (Signed)
Per Dr. Felecia Shelling: "The MRI of the cervical spine showed a couple disc herniations and some bone spurs.  This could cause some neck pain and pain that could enter the arms.  There is no pressure on the spinal cord so this should not affect your walking any."  LVM for daughter, Jeneen [place] to call about results.

## 2021-06-19 NOTE — Telephone Encounter (Signed)
Took call from phone staff and spoke w/ daughter, Debra Hudson. Relayed results per Dr. Garth Bigness note. She verbalized understanding.  She feels neuropathy has improved since last visit. Feels gait issues have not improved. If her mother turns head while walking, causes balance issue/dizziness. She is still doing PT 2x/week ordered by PCP. Does not feel this is helping. Aware I will send to Dr Felecia Shelling to review to see what next steps would be and will call back.

## 2021-06-23 ENCOUNTER — Encounter: Payer: Self-pay | Admitting: Physical Therapy

## 2021-06-23 ENCOUNTER — Ambulatory Visit: Payer: Medicare Other | Admitting: Physical Therapy

## 2021-06-23 DIAGNOSIS — M6281 Muscle weakness (generalized): Secondary | ICD-10-CM

## 2021-06-23 DIAGNOSIS — R293 Abnormal posture: Secondary | ICD-10-CM | POA: Diagnosis not present

## 2021-06-23 DIAGNOSIS — R2681 Unsteadiness on feet: Secondary | ICD-10-CM

## 2021-06-23 DIAGNOSIS — R2689 Other abnormalities of gait and mobility: Secondary | ICD-10-CM | POA: Diagnosis not present

## 2021-06-23 DIAGNOSIS — M79675 Pain in left toe(s): Secondary | ICD-10-CM | POA: Diagnosis not present

## 2021-06-23 NOTE — Therapy (Signed)
[place]. [place], [place], [address] Phone: [phone]   Fax:  [phone]  Physical Therapy Treatment  Patient Details  Name: Debra Hudson MRN: [id number] Date of Birth: [birth year] Referring Provider (PT): Lavone Orn, MD   Encounter Date: 06/23/2021   PT End of Session - 06/23/21 1340     Visit Number 7    Date for PT Re-Evaluation 07/23/21    PT Start Time 1300    PT Stop Time 1345    PT Time Calculation (min) 45 min    Activity Tolerance Patient tolerated treatment well    Behavior During Therapy [facility] for tasks assessed/performed             Past Medical History:  Diagnosis Date   Diabetes mellitus without complication ([place])    Hypertension     Past Surgical History:  Procedure Laterality Date   APPENDECTOMY     FOOT SURGERY Left    2 toe removed    There were no vitals filed for this visit.   Subjective Assessment - 06/23/21 1303     Subjective "Same as always" "Thought I would be a little better by now" Pt referring to the loss of her husband    Currently in Pain? No/denies                               [facility] Adult PT Treatment/Exercise - 06/23/21 0001       Ambulation/Gait   Gait Comments gait outdise aoind islond on L side around the front island one standing rest break. Short step as she fatgues.      High Level Balance   High Level Balance Activities Side stepping   Over WaTE bar   High Level Balance Comments marching on airex in parallel bars      Lumbar Exercises: Aerobic   Nustep lvl 5 x 6 mins      Knee/Hip Exercises: Machines for Strengthening   Cybex Knee Extension #5 2x10    Cybex Knee Flexion 20# 2x12      Knee/Hip Exercises: Standing   Other Standing Knee Exercises alternating step taps on cone 2x10 w/ SPC      Knee/Hip Exercises: Seated   Sit to Sand 2 sets;10 reps;with UE support   LE on airex                       PT Short Term Goals - 06/18/21 1408       PT SHORT TERM GOAL #1   Title Pt will be independent with initial HEP    Baseline HEP initiated today.    Time 1    Period Weeks    Status On-going    Target Date 06/25/21               PT Long Term Goals - 06/11/21 1309       PT LONG TERM GOAL #2   Title Pt will decrease 5xSTS time to 12 seconds or less, in order to demonstrate improved LE strength and power production.    Status Partially Met   14.90     PT LONG TERM GOAL #3   Title Patient will score at least 45 on BERG to demonstrate improved balance.    Status Partially Met   43/56     PT LONG TERM GOAL #4   Title Pt will decrease  TUG time with LRAD to 12s or less, in order to demonstrate improved gait speed and decreased fall fisk.    Status Partially Met                   Plan - 06/23/21 1340     Clinical Impression Statement Pt enters feeling fine physically. Progressed to outdoor ambulation. Cue to increase step length at times as she fatigues. She did require one standing rest break. HHA x1 when stepping off curb required. Some LE weakness present with sit to stands cue needed to come to full standing. CGA needed with marching on airex, pt tended to drift forward edge of airex when marching. Pt ha snot tried new HEP. Reinforced importance of HEP.    Personal Factors and Comorbidities Age;Fitness    Examination-Activity Limitations Locomotion Level;Reach Overhead;Bend;Caring for Others;Squat;Stairs;Stand;Toileting;Dressing;Transfers    Examination-Participation Restrictions Art gallery manager    Stability/Clinical Decision Making Evolving/Moderate complexity    Rehab Potential Good    PT Frequency 2x / week    PT Treatment/Interventions ADLs/Self Care Home Management;Electrical Stimulation;Moist Heat;DME Instruction;Neuromuscular re-education;Balance training;Therapeutic exercise;Therapeutic activities;Patient/family education;Stair training;Gait  training;Functional mobility training;Manual techniques;Energy conservation;Taping;Vestibular;Visual/perceptual remediation/compensation;Orthotic Fit/Training;Passive range of motion;Dry needling    PT Next Visit Plan Assess [place]rance to HEP, continue with strengtheing and balance training.             Patient will benefit from skilled therapeutic intervention in order to improve the following deficits and impairments:  Abnormal gait, Decreased coordination, Difficulty walking, Postural dysfunction, Decreased strength, Decreased mobility, Improper body mechanics, Impaired flexibility, Decreased balance, Pain, Impaired perceived functional ability, Decreased activity tolerance, Decreased endurance  Visit Diagnosis: Unsteadiness on feet  Muscle weakness (generalized)  Abnormal posture     Problem List Patient Active Problem List   Diagnosis Date Noted   Urinary urgency 08/22/2020   Anxiety 08/22/2020   Atrophic vaginitis 08/22/2020   Dysuria 08/22/2020   Female stress incontinence 08/22/2020   Closed fracture of right side of maxilla with routine healing 05/21/2020    Scot Jun, PTA 06/23/2021, 1:44 PM  [place]. [place], [place], [address] Phone: [phone]   Fax:  [phone]  Name: Debra Hudson MRN: [id number] Date of Birth: [birth year]

## 2021-06-25 ENCOUNTER — Ambulatory Visit: Payer: Medicare Other | Admitting: Physical Therapy

## 2021-06-25 ENCOUNTER — Encounter: Payer: Self-pay | Admitting: Physical Therapy

## 2021-06-25 DIAGNOSIS — R2689 Other abnormalities of gait and mobility: Secondary | ICD-10-CM | POA: Diagnosis not present

## 2021-06-25 DIAGNOSIS — M6281 Muscle weakness (generalized): Secondary | ICD-10-CM

## 2021-06-25 DIAGNOSIS — M79675 Pain in left toe(s): Secondary | ICD-10-CM | POA: Diagnosis not present

## 2021-06-25 DIAGNOSIS — R2681 Unsteadiness on feet: Secondary | ICD-10-CM

## 2021-06-25 DIAGNOSIS — R293 Abnormal posture: Secondary | ICD-10-CM

## 2021-06-25 NOTE — Telephone Encounter (Signed)
Called daughter, Jeneen Montgomery. Relayed Dr. Garth Bigness message. She verbalized understanding.  States mother does not eat properly. They have figured out that when she is at home, she is not eating right.  States she is not hungry, usually eats once per day. If they eat out, she will eat well. She is diabetic and on metformin. Does not check blood sugar regularly (PCP does not require this). For breakfast, eats instant breakfast (smoothie). Tried to get her to drink boost/ensure and not willing to do these options. Not doing PT exercises at home. Only does exercises at PT.   I recommended she have her f/u with PCP to discuss closer management of diabetes/blood sugar and discuss referring her to nutritionist. Both could play a role in her dizziness/balance issues. She will f/u with her PCP for further work up and will f/u with our office if anything else is needed.

## 2021-06-25 NOTE — Therapy (Signed)
[place]. [place], [place], [address] Phone: [phone]   Fax:  [phone]  Physical Therapy Treatment  Patient Details  Name: Debra Hudson MRN: [id number] Date of Birth: [birth year] Referring Provider (PT): Lavone Orn, MD   Encounter Date: 06/25/2021   PT End of Session - 06/25/21 1342     Visit Number 8    Date for PT Re-Evaluation 07/23/21    PT Start Time 1300    PT Stop Time 1345    PT Time Calculation (min) 45 min    Activity Tolerance Patient tolerated treatment well    Behavior During Therapy [facility] for tasks assessed/performed             Past Medical History:  Diagnosis Date   Diabetes mellitus without complication ([place])    Hypertension     Past Surgical History:  Procedure Laterality Date   APPENDECTOMY     FOOT SURGERY Left    2 toe removed    There were no vitals filed for this visit.   Subjective Assessment - 06/25/21 1259     Subjective Doing fine, back was hurting earlier tylenol and it got better    Currently in Pain? No/denies                               [facility] Adult PT Treatment/Exercise - 06/25/21 0001       Ambulation/Gait   Gait Comments 3 laps around front island in the back      Lumbar Exercises: Aerobic   Nustep lvl 4 x 6 mins      Knee/Hip Exercises: Machines for Strengthening   Cybex Knee Extension #5 2x10    Cybex Knee Flexion 20# 2x12      Knee/Hip Exercises: Standing   Heel Raises Both;2 sets;10 reps;2 seconds    Forward Step Up Both;2 sets;5 reps;Hand Hold: 0;Step Height: 4"    Walking with Sports Cord 20lb side step x5 each                       PT Short Term Goals - 06/18/21 1408       PT SHORT TERM GOAL #1   Title Pt will be independent with initial HEP    Baseline HEP initiated today.    Time 1    Period Weeks    Status On-going    Target Date 06/25/21               PT Long Term Goals -  06/11/21 1309       PT LONG TERM GOAL #2   Title Pt will decrease 5xSTS time to 12 seconds or less, in order to demonstrate improved LE strength and power production.    Status Partially Met   14.90     PT LONG TERM GOAL #3   Title Patient will score at least 45 on BERG to demonstrate improved balance.    Status Partially Met   43/56     PT LONG TERM GOAL #4   Title Pt will decrease TUG time with LRAD to 12s or less, in order to demonstrate improved gait speed and decreased fall fisk.    Status Partially Met                   Plan - 06/25/21 1343     Clinical Impression Statement Pt did well  progressing with outdoor ambulation. Pt able to tolerated gait with greater distance maintaining stability. Again hesitation when going up and down curbs. Cue to step straight back instead of to the side with step ups. Some instability with resisted side steps when the resistance was on her L side.    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             Patient will benefit from skilled therapeutic intervention in order to improve the following deficits and impairments:  Abnormal gait,  Decreased coordination, Difficulty walking, Postural dysfunction, Decreased strength, Decreased mobility, Improper body mechanics, Impaired flexibility, Decreased balance, Pain, Impaired perceived functional ability, Decreased activity tolerance, Decreased endurance  Visit Diagnosis: Unsteadiness on feet  Muscle weakness (generalized)  Abnormal posture     Problem List Patient Active Problem List   Diagnosis Date Noted   Urinary urgency 08/22/2020   Anxiety 08/22/2020   Atrophic vaginitis 08/22/2020   Dysuria 08/22/2020   Female stress incontinence 08/22/2020   Closed fracture of right side of maxilla with routine healing 05/21/2020    Debra Hudson, PTA 06/25/2021, 1:46 PM  [place]. [place], [place], [address] Phone: [phone]   Fax:  [phone]  Name: Debra Hudson MRN: [id number] Date of Birth: [birth year]

## 2021-07-02 ENCOUNTER — Ambulatory Visit: Payer: Medicare Other | Admitting: Physical Therapy

## 2021-07-02 ENCOUNTER — Encounter: Payer: Self-pay | Admitting: Physical Therapy

## 2021-07-02 DIAGNOSIS — R2681 Unsteadiness on feet: Secondary | ICD-10-CM

## 2021-07-02 DIAGNOSIS — R2689 Other abnormalities of gait and mobility: Secondary | ICD-10-CM | POA: Diagnosis not present

## 2021-07-02 DIAGNOSIS — M6281 Muscle weakness (generalized): Secondary | ICD-10-CM | POA: Diagnosis not present

## 2021-07-02 DIAGNOSIS — R293 Abnormal posture: Secondary | ICD-10-CM

## 2021-07-02 DIAGNOSIS — M79675 Pain in left toe(s): Secondary | ICD-10-CM | POA: Diagnosis not present

## 2021-07-02 NOTE — Therapy (Signed)
[place]. [place], [place], [address] Phone: [phone]   Fax:  [phone]  Physical Therapy Treatment  Patient Details  Name: Debra Hudson MRN: [id number] Date of Birth: 08-21-1935 Referring Provider (PT): Lavone Orn, MD   Encounter Date: 07/02/2021   PT End of Session - 07/02/21 1342     Visit Number 9    Date for PT Re-Evaluation 07/23/21    PT Start Time 1300    PT Stop Time 1345    PT Time Calculation (min) 45 min    Activity Tolerance Patient tolerated treatment well    Behavior During Therapy [facility] for tasks assessed/performed             Past Medical History:  Diagnosis Date   Diabetes mellitus without complication ([place])    Hypertension     Past Surgical History:  Procedure Laterality Date   APPENDECTOMY     FOOT SURGERY Left    2 toe removed    There were no vitals filed for this visit.   Subjective Assessment - 07/02/21 1304     Subjective Same old same old, some better, walking around the house more. Walking to mailbox without cane    Currently in Pain? No/denies                               [facility] Adult PT Treatment/Exercise - 07/02/21 0001       High Level Balance   High Level Balance Comments side step on and off airex      Lumbar Exercises: Aerobic   Nustep lvl 4 x 7 mins      Lumbar Exercises: Standing   Row Strengthening;Both;20 reps;Theraband    Theraband Level (Row) Level 3 (Green)      Knee/Hip Exercises: Machines for Strengthening   Cybex Knee Extension #10 2x10    Cybex Knee Flexion 25# 2x10      Knee/Hip Exercises: Standing   Forward Step Up Both;1 set;10 reps;Hand Hold: 0;Step Height: 4"    Other Standing Knee Exercises alt 6in box taps 2x10      Knee/Hip Exercises: Seated   Sit to Sand 3 sets;5 reps;without UE support   on Airex Ue on knees                      PT Short Term Goals - 06/18/21 1408       PT  SHORT TERM GOAL #1   Title Pt will be independent with initial HEP    Baseline HEP initiated today.    Time 1    Period Weeks    Status On-going    Target Date 06/25/21               PT Long Term Goals - 06/11/21 1309       PT LONG TERM GOAL #2   Title Pt will decrease 5xSTS time to 12 seconds or less, in order to demonstrate improved LE strength and power production.    Status Partially Met   14.90     PT LONG TERM GOAL #3   Title Patient will score at least 45 on BERG to demonstrate improved balance.    Status Partially Met   43/56     PT LONG TERM GOAL #4   Title Pt will decrease TUG time with LRAD to 12s or less, in order to demonstrate improved  gait speed and decreased fall fisk.    Status Partially Met                   Plan - 07/02/21 1342     Clinical Impression Statement Pt enters feeling well ambulating without cane. She was able to progress with step up tolerating more reps. LE would often clip box with alt box taps requiring min assist to maintain balance. increase resistance tolerated with seated leg curls and extensions. Some fatigue and weakness with sit to stands. CGA a time with side step on airex.    Personal Factors and Comorbidities Age;Fitness    Examination-Activity Limitations Locomotion Level;Reach Overhead;Bend;Caring for Others;Squat;Stairs;Stand;Toileting;Dressing;Transfers    Examination-Participation Restrictions Art gallery manager    Stability/Clinical Decision Making Evolving/Moderate complexity    Rehab Potential Good    PT Frequency 2x / week    PT Treatment/Interventions ADLs/Self Care Home Management;Electrical Stimulation;Moist Heat;DME Instruction;Neuromuscular re-education;Balance training;Therapeutic exercise;Therapeutic activities;Patient/family education;Stair training;Gait training;Functional mobility training;Manual techniques;Energy conservation;Taping;Vestibular;Visual/perceptual remediation/compensation;Orthotic  Fit/Training;Passive range of motion;Dry needling    PT Next Visit Plan continue with strengtheing and balance training.             Patient will benefit from skilled therapeutic intervention in order to improve the following deficits and impairments:  Abnormal gait, Decreased coordination, Difficulty walking, Postural dysfunction, Decreased strength, Decreased mobility, Improper body mechanics, Impaired flexibility, Decreased balance, Pain, Impaired perceived functional ability, Decreased activity tolerance, Decreased endurance  Visit Diagnosis: Unsteadiness on feet  Muscle weakness (generalized)  Abnormal posture     Problem List Patient Active Problem List   Diagnosis Date Noted   Urinary urgency 08/22/2020   Anxiety 08/22/2020   Atrophic vaginitis 08/22/2020   Dysuria 08/22/2020   Female stress incontinence 08/22/2020   Closed fracture of right side of maxilla with routine healing 05/21/2020    Scot Jun, PTA 07/02/2021, 1:44 PM  [place]. [place], [place], [address] Phone: [phone]   Fax:  [phone]  Name: Debra Hudson MRN: [id number] Date of Birth: 03-Nov-1935

## 2021-07-04 ENCOUNTER — Ambulatory Visit: Payer: Medicare Other | Admitting: Physical Therapy

## 2021-07-07 ENCOUNTER — Ambulatory Visit: Payer: Medicare Other | Admitting: Physical Therapy

## 2021-07-09 ENCOUNTER — Ambulatory Visit: Payer: Medicare Other | Admitting: Physical Therapy

## 2021-07-14 ENCOUNTER — Ambulatory Visit: Payer: Medicare Other | Attending: Internal Medicine | Admitting: Physical Therapy

## 2021-07-14 ENCOUNTER — Encounter: Payer: Self-pay | Admitting: Physical Therapy

## 2021-07-14 DIAGNOSIS — R2681 Unsteadiness on feet: Secondary | ICD-10-CM | POA: Diagnosis not present

## 2021-07-14 DIAGNOSIS — R293 Abnormal posture: Secondary | ICD-10-CM | POA: Diagnosis not present

## 2021-07-14 DIAGNOSIS — M6281 Muscle weakness (generalized): Secondary | ICD-10-CM | POA: Insufficient documentation

## 2021-07-14 DIAGNOSIS — R2689 Other abnormalities of gait and mobility: Secondary | ICD-10-CM | POA: Insufficient documentation

## 2021-07-14 DIAGNOSIS — M79675 Pain in left toe(s): Secondary | ICD-10-CM | POA: Diagnosis not present

## 2021-07-14 NOTE — Therapy (Signed)
[place]. [place], [place], [address] Phone: [phone]   Fax:  [phone]  Physical Therapy Treatment Progress Note Reporting Period 05/14/21 to 07/14/21  See note below for Objective Data and Assessment of Progress/Goals.     Patient Details  Name: Debra Hudson MRN: [id number] Date of Birth: 1935-02-07 Referring Provider (PT): Lavone Orn, MD   Encounter Date: 07/14/2021   PT End of Session - 07/14/21 1401     Visit Number 10    Date for PT Re-Evaluation 08/19/21    PT Start Time 2376    PT Stop Time 1355    PT Time Calculation (min) 42 min    Activity Tolerance Patient tolerated treatment well    Behavior During Therapy [facility] for tasks assessed/performed             Past Medical History:  Diagnosis Date   Diabetes mellitus without complication ([place])    Hypertension     Past Surgical History:  Procedure Laterality Date   APPENDECTOMY     FOOT SURGERY Left    2 toe removed    There were no vitals filed for this visit.   Subjective Assessment - 07/14/21 1314     Subjective Patient reports she is having a bit of trouble ambulating today.    Pertinent History L second toe amputation.    Limitations Walking;House hold activities;Standing    How long can you sit comfortably? N/A    How long can you stand comfortably? She becomes unbalanced after 30 minutes.    How long can you walk comfortably? Maybe 10 minutes.    Diagnostic tests Cervical Spine XR: 1. No acute intracranial process.  2. Minimally displaced fractures of the bilateral nasal bones, with  overlying soft tissue swelling.  3. No acute cervical spine fracture. Extensive multilevel  spondylosis and facet hypertrophy; CT Head: negative for intracranial abnormality    Patient Stated Goals Improved balance, walk without the cane, if possible, walk further without fatigue.    Currently in Pain? No/denies    Pain Onset More than a  month ago                [facility] PT Assessment - 07/14/21 0001       Standardized Balance Assessment   Five times sit to stand comments  19.14      Berg Balance Test   Sit to Stand Able to stand without using hands and stabilize independently    Standing Unsupported Able to stand safely 2 minutes    Sitting with Back Unsupported but Feet Supported on Floor or Stool Able to sit safely and securely 2 minutes    Stand to Sit Sits safely with minimal use of hands    Transfers Able to transfer safely, definite need of hands    Standing Unsupported with Eyes Closed Able to stand 10 seconds safely    Standing Unsupported with Feet Together Able to place feet together independently and stand for 1 minute with supervision    From Standing, Reach Forward with Outstretched Arm Can reach forward >12 cm safely (5")    From Standing Position, Pick up Object from Floor Able to pick up shoe safely and easily    From Standing Position, Turn to Look Behind Over each Shoulder Looks behind from both sides and weight shifts well    Turn 360 Degrees Able to turn 360 degrees safely but slowly    Standing Unsupported, Alternately Place  Feet on Step/Stool Able to stand independently and complete 8 steps >20 seconds    Standing Unsupported, One Foot in Front Able to plae foot ahead of the other independently and hold 30 seconds    Standing on One Leg Tries to lift leg/unable to hold 3 seconds but remains standing independently    Total Score 46      Timed Up and Go Test   Normal TUG (seconds) 15.3                           [facility] Adult PT Treatment/Exercise - 07/14/21 0001       Lumbar Exercises: Aerobic   Nustep lvl 4 x 6 mins      Knee/Hip Exercises: Standing   Other Standing Knee Exercises heel raises on step x 8 reps with UE suppor tfor balance, B side stepping x 10 reps against red Tband resistance at her knees.      Knee/Hip Exercises: Seated   Other Seated Knee/Hip Exercises  Scapular retraction and seated trunk rotation with arms elevated, 5 reps of each.    Sit to Sand 1 set;5 reps;without UE support   VC for correct form, incluidng keeping knees apart.                      PT Short Term Goals - 07/14/21 1319       PT SHORT TERM GOAL #1   Title Pt will be independent with initial HEP    Baseline Patient reports she has not been performing her HEP consistently. Therapsit encouraged her to try to perform.    Time 1    Period Weeks    Status On-going    Target Date 06/25/21               PT Long Term Goals - 07/14/21 1321       PT LONG TERM GOAL #1   Title I with final HEP    Time 3    Period Weeks    Status On-going      PT LONG TERM GOAL #2   Title Pt will decrease 5xSTS time to 12 seconds or less, in order to demonstrate improved LE strength and power production.    Baseline 19.14    Time 3    Period Weeks    Status On-going      PT LONG TERM GOAL #3   Title Patient will score at least 45 on BERG to demonstrate improved balance.    Baseline 46    Status Achieved      PT LONG TERM GOAL #4   Title Pt will decrease TUG time with LRAD to 12s or less, in order to demonstrate improved gait speed and decreased fall fisk.    Baseline 15.30    Time 3    Period Weeks    Status On-going                   Plan - 07/14/21 1402     Clinical Impression Statement Patient reports she feels well, she has not been consistently performing HEP and still feels unsteady with gait and standing. Re-assessed her functional status for 10th vist PN and determined patient would benefit from additional visits to decrease fall risk. Patient's HEP reviewed and patient encouraged to perform to ssess it at home.    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 Evolving/Moderate complexity    Clinical Decision Making Moderate    Rehab Potential Good    PT Frequency 2x / week    PT Duration 4 weeks    PT Treatment/Interventions ADLs/Self Care Home Management;Electrical Stimulation;Moist Heat;DME Instruction;Neuromuscular re-education;Balance training;Therapeutic exercise;Therapeutic activities;Patient/family education;Stair training;Gait training;Functional mobility training;Manual techniques;Energy conservation;Taping;Vestibular;Visual/perceptual remediation/compensation;Orthotic Fit/Training;Passive range of motion;Dry needling    PT Next Visit Plan continue with strengtheing and balance training.    PT Home Exercise Plan [id number]    Consulted and Agree with Plan of Care Patient             Patient will benefit from skilled therapeutic intervention in order to improve the following deficits and impairments:  Abnormal gait, Decreased coordination, Difficulty walking, Postural dysfunction, Decreased strength, Decreased mobility, Improper body mechanics, Impaired flexibility, Decreased balance, Pain, Impaired perceived functional ability, Decreased activity tolerance, Decreased endurance  Visit Diagnosis: Unsteadiness on feet - Plan: PT plan of care cert/re-cert  Muscle weakness (generalized) - Plan: PT plan of care cert/re-cert  Abnormal posture - Plan: PT plan of care cert/re-cert  Other abnormalities of gait and mobility - Plan: PT plan of care cert/re-cert  Pain in left toe(s) - Plan: PT plan of care cert/re-cert     Problem List Patient Active Problem List   Diagnosis Date Noted   Urinary urgency 08/22/2020   Anxiety 08/22/2020   Atrophic vaginitis 08/22/2020   Dysuria 08/22/2020   Female stress incontinence 08/22/2020   Closed fracture of right side of maxilla with routine healing 05/21/2020    Marcelina Morel, DPT 07/14/2021, 2:11 PM  [place]. [place], [place], [address] Phone: [phone]   Fax:  [phone]  Name: Debra Hudson MRN: [id number] Date of Birth: [birth year]

## 2021-07-16 ENCOUNTER — Ambulatory Visit: Payer: Medicare Other | Admitting: Physical Therapy

## 2021-07-21 ENCOUNTER — Ambulatory Visit: Payer: Medicare Other | Admitting: Physical Therapy

## 2021-07-23 ENCOUNTER — Ambulatory Visit: Payer: Medicare Other | Admitting: Physical Therapy

## 2021-07-24 DIAGNOSIS — F324 Major depressive disorder, single episode, in partial remission: Secondary | ICD-10-CM | POA: Diagnosis not present

## 2021-07-24 DIAGNOSIS — K219 Gastro-esophageal reflux disease without esophagitis: Secondary | ICD-10-CM | POA: Diagnosis not present

## 2021-07-24 DIAGNOSIS — I10 Essential (primary) hypertension: Secondary | ICD-10-CM | POA: Diagnosis not present

## 2021-07-24 DIAGNOSIS — E1169 Type 2 diabetes mellitus with other specified complication: Secondary | ICD-10-CM | POA: Diagnosis not present

## 2021-07-24 DIAGNOSIS — E782 Mixed hyperlipidemia: Secondary | ICD-10-CM | POA: Diagnosis not present

## 2021-07-28 ENCOUNTER — Ambulatory Visit: Payer: Medicare Other | Admitting: Physical Therapy

## 2021-07-30 ENCOUNTER — Ambulatory Visit: Payer: Medicare Other | Admitting: Physical Therapy

## 2021-08-12 DIAGNOSIS — Z23 Encounter for immunization: Secondary | ICD-10-CM | POA: Diagnosis not present

## 2021-08-20 ENCOUNTER — Encounter: Payer: Self-pay | Admitting: Neurology

## 2021-08-20 ENCOUNTER — Ambulatory Visit (INDEPENDENT_AMBULATORY_CARE_PROVIDER_SITE_OTHER): Payer: Medicare Other | Admitting: Neurology

## 2021-08-20 VITALS — BP 133/52 | HR 59 | Ht 64.0 in | Wt 128.5 lb

## 2021-08-20 DIAGNOSIS — G250 Essential tremor: Secondary | ICD-10-CM

## 2021-08-20 DIAGNOSIS — R269 Unspecified abnormalities of gait and mobility: Secondary | ICD-10-CM | POA: Diagnosis not present

## 2021-08-20 DIAGNOSIS — G629 Polyneuropathy, unspecified: Secondary | ICD-10-CM

## 2021-08-20 DIAGNOSIS — R413 Other amnesia: Secondary | ICD-10-CM | POA: Diagnosis not present

## 2021-08-20 NOTE — Progress Notes (Signed)
[facility]  PATIENT: Debra Hudson DOB: 1935-09-28  REFERRING DOCTOR OR PCP: Tyson Dense D.P.M. SOURCE: Patient, notes from podiatry, imaging and lab reports, CT scan personally reviewed.  _________________________________   HISTORICAL  CHIEF COMPLAINT:  Chief Complaint  Patient presents with   Follow-up    Rm 1, alone. Pt reports no change in walk since last ov. Daughter have noticed some short term memory loss.     HISTORY OF PRESENT ILLNESS:  Debra Hudson is a 86 y.o.w [place] with neuropathy.  Update 08/20/2021: She ireports less  pain and swelling in her second toe.   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    She had noted numbness in both feet around that time.   She has some tingling but no significant pain.     She has reduced gait and reduced balance.   She feels the gait is stable compared to last visit.   She uses a cane.   No falls.     I noted retropulsion on the last exam.     She has had NIDDM x 1-2 year and is on metformin.        She notes reduced STM but good LTM.   Her daughters note that she does not recall a conversation a day later.     She has some difficulty remembering dates.  She lves alone.   She drives and does some shopping and goes with daughters other times.       She lost her husband last June 2022 and has had depression.   She takes citalopram but not sure if any benefit.  She takes lorazepam and it helps her sleep.      Imaging: CT scan of the head 10/01/2020 was normal for age.   REVIEW OF SYSTEMS: Constitutional: No fevers, chills, sweats, or change in appetite Eyes: No visual changes, double vision, eye pain Ear, nose and throat: No hearing loss, ear pain, nasal congestion, sore throat Cardiovascular: No chest pain, palpitations Respiratory:  No shortness of breath at rest or with exertion.   No wheezes GastrointestinaI: No nausea, vomiting, diarrhea, abdominal pain, fecal incontinence Genitourinary:  No dysuria,  urinary retention or frequency.  No nocturia. Musculoskeletal:  No neck pain, back pain Integumentary: No rash, pruritus, skin lesions Neurological: as above Psychiatric: No depression at this time.  No anxiety Endocrine: No palpitations, diaphoresis, change in appetite, change in weigh or increased thirst Hematologic/Lymphatic:  No anemia, purpura, petechiae. Allergic/Immunologic: No itchy/runny eyes, nasal congestion, recent allergic reactions, rashes  ALLERGIES: Allergies  Allergen Reactions   Erythromycin Diarrhea   Lisinopril     Other reaction(s): Other (See Comments)   Sertraline Hcl     Other reaction(s): Other (See Comments)    HOME MEDICATIONS:  Current Outpatient Medications:    aspirin EC 81 MG tablet, Take 81 mg by mouth daily., Disp: , Rfl:    citalopram (CELEXA) 20 MG tablet, Take 20 mg by mouth daily., Disp: , Rfl:    furosemide (LASIX) 40 MG tablet, Take 40 mg by mouth daily., Disp: , Rfl:    latanoprost (XALATAN) 0.005 % ophthalmic solution, 1 drop at bedtime., Disp: , Rfl:    LORazepam (ATIVAN) 1 MG tablet, Take 1 mg by mouth 2 (two) times daily as needed., Disp: , Rfl:    losartan-hydrochlorothiazide (HYZAAR) 100-25 MG tablet, Take 1 tablet by mouth daily., Disp: , Rfl: 2   metFORMIN (GLUCOPHAGE-XR) 500 MG 24 hr tablet, Take 500 mg by  mouth 2 (two) times daily., Disp: , Rfl:    metoprolol tartrate (LOPRESSOR) 25 MG tablet, Take 25 mg by mouth 2 (two) times daily., Disp: , Rfl: 3   ondansetron (ZOFRAN) 4 MG tablet, Take 1 tablet (4 mg total) by mouth every 8 (eight) hours as needed., Disp: 20 tablet, Rfl: 0   ondansetron (ZOFRAN) 4 MG tablet, Take 1 tablet (4 mg total) by mouth every 8 (eight) hours as needed., Disp: 20 tablet, Rfl: 0   potassium chloride (MICRO-K) 10 MEQ CR capsule, Take 10 mEq by mouth daily., Disp: , Rfl: 3   SIMBRINZA 1-0.2 % SUSP, Apply 1 drop to eye 2 (two) times daily., Disp: , Rfl:    simvastatin (ZOCOR) 40 MG tablet, Take 1 tablet by  mouth at bedtime., Disp: , Rfl:    valsartan-hydrochlorothiazide (DIOVAN-HCT) 160-12.5 MG tablet, Take 1 tablet by mouth daily., Disp: , Rfl:   PAST MEDICAL HISTORY: Past Medical History:  Diagnosis Date   Diabetes mellitus without complication ([place])    Hypertension     PAST SURGICAL HISTORY: Past Surgical History:  Procedure Laterality Date   APPENDECTOMY     FOOT SURGERY Left    2 toe removed    FAMILY HISTORY: Family History  Problem Relation Age of Onset   Heart Problems Mother    High blood pressure Mother    High blood pressure Father    Atrial fibrillation Sister     SOCIAL HISTORY:  Social History   Socioeconomic History   Marital status: Widowed    Spouse name: Not on file   Number of children: 2   Years of education: Not on file   Highest education level: High school graduate  Occupational History   Not on file  Tobacco Use   Smoking status: Former    Packs/day: 0.25    Years: 15.00    Total pack years: 3.75    Types: Cigarettes    Quit date: 14    Years since quitting: 34.5   Smokeless tobacco: Never  Substance and Sexual Activity   Alcohol use: Never   Drug use: Never   Sexual activity: Not on file  Other Topics Concern   Not on file  Social History Narrative   Lives alone   R handed   Caffeine:rare   Social Determinants of Health   Financial Resource Strain: Not on file  Food Insecurity: Not on file  Transportation Needs: Not on file  Physical Activity: Not on file  Stress: Not on file  Social Connections: Not on file  Intimate Partner Violence: Not on file     PHYSICAL EXAM  Vitals:   08/20/21 1400  BP: (!) 133/52  Pulse: (!) 59  Weight: 128 lb 8 oz (58.3 kg)  Height: '5\' 4"'$  (1.626 m)    Body mass index is 22.06 kg/m.   General: The patient is well-developed and well-nourished and in no acute distress.  No bradykinesia.  HEENT:  Head is [place]/AT.  Sclera are anicteric.   Neck:   The neck is nontender.   Skin:  Extremities show digit 2 amputation on left foot.  Mild skin changes in feet  .  No rash    Neurologic Exam  Mental status: The patient is alert and oriented x 3 at the time of the examination. The patient has mildly reduced short-term recall (2/3) numbers on the clock were mildly misplaced in the hands were mildly misplaced.  Focus was mildly reduced   speech is normal.  Cranial nerves: Extraocular movements are full. Normal facial strength No obvious hearing deficits are noted.  Motor: She has a fine tremor in the chin but not in the hands.  Muscle bulk is normal.   Tone is normal. Strength is  5 / 5 in all 4 extremities.   Sensory: Sensory testing is intact to pinprick, soft touch and vibration sensation in the arms.  She has mildly reduced vibration sensation in the toes but normal at the ankles.  She . has reduced pinprick and temperature sensation to above the ankles.  And markedly reduced in the feet  Coordination: Cerebellar testing reveals good finger-nose-finger and heel-to-shin bilaterally.  Gait and station: Station is normal.  Gait has reduced stride and 5 step 180 degree turn  She has retropulsion.  Romberg is borderline.   Reflexes: Deep tendon reflexes are symmetric and normal in arms, mildly increased at the knees and reduced at the ankles.   Plantar responses are flexor.      ASSESSMENT AND PLAN  Polyneuropathy  Gait disturbance  Memory loss  Length dependent sensory and motor polyneuropathy  - labs ok.   Possibly related to her DM.   Dysesthesias not sever so will hold off on a treatment for them.   She is stable, if worsens, consider NCV/EMG and also consider gabapentin or lamotrigine  She has a fine tremor in the chin but none of the hands.  This is still most likely a mild benign essential tremor.    Not severe enough to treat She has cervical DJD but no spinal stenosis.  If pain starts going into arm/shoulder, may need medication or intervention.     She  will return to see [place] in about 3 to 4 months or sooner if there are new or worsening neurologic symptoms.   Deniss Wormley A. Felecia Shelling, MD, [facility] 8/67/6720, 9:47 PM Certified in Neurology, Clinical Neurophysiology, Sleep Medicine and Neuroimaging  [facility] [address] [place], [place] [address] [phone]

## 2021-10-15 DIAGNOSIS — E538 Deficiency of other specified B group vitamins: Secondary | ICD-10-CM | POA: Diagnosis not present

## 2021-10-15 DIAGNOSIS — F4321 Adjustment disorder with depressed mood: Secondary | ICD-10-CM | POA: Diagnosis not present

## 2021-10-15 DIAGNOSIS — E1140 Type 2 diabetes mellitus with diabetic neuropathy, unspecified: Secondary | ICD-10-CM | POA: Diagnosis not present

## 2021-10-15 DIAGNOSIS — F329 Major depressive disorder, single episode, unspecified: Secondary | ICD-10-CM | POA: Diagnosis not present

## 2021-10-15 DIAGNOSIS — I10 Essential (primary) hypertension: Secondary | ICD-10-CM | POA: Diagnosis not present

## 2021-10-15 DIAGNOSIS — R269 Unspecified abnormalities of gait and mobility: Secondary | ICD-10-CM | POA: Diagnosis not present

## 2021-10-15 DIAGNOSIS — Z5181 Encounter for therapeutic drug level monitoring: Secondary | ICD-10-CM | POA: Diagnosis not present

## 2021-10-27 ENCOUNTER — Emergency Department (HOSPITAL_BASED_OUTPATIENT_CLINIC_OR_DEPARTMENT_OTHER)
Admission: EM | Admit: 2021-10-27 | Discharge: 2021-10-27 | Disposition: A | Discharge status: Home / Self Care | Payer: Medicare Other | Attending: Emergency Medicine | Admitting: Emergency Medicine

## 2021-10-27 ENCOUNTER — Encounter (HOSPITAL_BASED_OUTPATIENT_CLINIC_OR_DEPARTMENT_OTHER): Payer: Self-pay

## 2021-10-27 ENCOUNTER — Emergency Department (HOSPITAL_BASED_OUTPATIENT_CLINIC_OR_DEPARTMENT_OTHER): Payer: Medicare Other

## 2021-10-27 ENCOUNTER — Other Ambulatory Visit: Payer: Self-pay

## 2021-10-27 DIAGNOSIS — S0990XA Unspecified injury of head, initial encounter: Secondary | ICD-10-CM | POA: Diagnosis not present

## 2021-10-27 DIAGNOSIS — S0242XB Fracture of alveolus of maxilla, initial encounter for open fracture: Secondary | ICD-10-CM | POA: Insufficient documentation

## 2021-10-27 DIAGNOSIS — S0993XA Unspecified injury of face, initial encounter: Secondary | ICD-10-CM | POA: Diagnosis present

## 2021-10-27 DIAGNOSIS — Y9301 Activity, walking, marching and hiking: Secondary | ICD-10-CM | POA: Insufficient documentation

## 2021-10-27 DIAGNOSIS — Y92009 Unspecified place in unspecified non-institutional (private) residence as the place of occurrence of the external cause: Secondary | ICD-10-CM | POA: Diagnosis not present

## 2021-10-27 DIAGNOSIS — E119 Type 2 diabetes mellitus without complications: Secondary | ICD-10-CM | POA: Diagnosis not present

## 2021-10-27 DIAGNOSIS — Z7984 Long term (current) use of oral hypoglycemic drugs: Secondary | ICD-10-CM | POA: Insufficient documentation

## 2021-10-27 DIAGNOSIS — S01512A Laceration without foreign body of oral cavity, initial encounter: Secondary | ICD-10-CM | POA: Insufficient documentation

## 2021-10-27 DIAGNOSIS — Z043 Encounter for examination and observation following other accident: Secondary | ICD-10-CM | POA: Diagnosis not present

## 2021-10-27 DIAGNOSIS — M25562 Pain in left knee: Secondary | ICD-10-CM | POA: Diagnosis not present

## 2021-10-27 DIAGNOSIS — W19XXXA Unspecified fall, initial encounter: Secondary | ICD-10-CM

## 2021-10-27 DIAGNOSIS — I10 Essential (primary) hypertension: Secondary | ICD-10-CM | POA: Insufficient documentation

## 2021-10-27 DIAGNOSIS — W01198A Fall on same level from slipping, tripping and stumbling with subsequent striking against other object, initial encounter: Secondary | ICD-10-CM | POA: Diagnosis not present

## 2021-10-27 DIAGNOSIS — S0240DA Maxillary fracture, left side, initial encounter for closed fracture: Secondary | ICD-10-CM | POA: Diagnosis not present

## 2021-10-27 DIAGNOSIS — M25561 Pain in right knee: Secondary | ICD-10-CM | POA: Diagnosis not present

## 2021-10-27 DIAGNOSIS — S0242XA Fracture of alveolus of maxilla, initial encounter for closed fracture: Secondary | ICD-10-CM | POA: Diagnosis not present

## 2021-10-27 DIAGNOSIS — S02401B Maxillary fracture, unspecified, initial encounter for open fracture: Secondary | ICD-10-CM

## 2021-10-27 DIAGNOSIS — Z7982 Long term (current) use of aspirin: Secondary | ICD-10-CM | POA: Insufficient documentation

## 2021-10-27 DIAGNOSIS — S0240CA Maxillary fracture, right side, initial encounter for closed fracture: Secondary | ICD-10-CM | POA: Diagnosis not present

## 2021-10-27 DIAGNOSIS — S022XXA Fracture of nasal bones, initial encounter for closed fracture: Secondary | ICD-10-CM | POA: Diagnosis not present

## 2021-10-27 DIAGNOSIS — S01511A Laceration without foreign body of lip, initial encounter: Secondary | ICD-10-CM

## 2021-10-27 DIAGNOSIS — S02401A Maxillary fracture, unspecified, initial encounter for closed fracture: Secondary | ICD-10-CM | POA: Diagnosis not present

## 2021-10-27 LAB — CBG MONITORING, ED: Glucose-Capillary: 109 mg/dL — ABNORMAL HIGH (ref 70–99)

## 2021-10-27 MED ORDER — DOXYCYCLINE HYCLATE 100 MG PO CAPS: 100.0000 mg | ORAL_CAPSULE | Freq: Two times a day (BID) | ORAL | 0 refills | Status: DC

## 2021-10-27 MED ORDER — ACETAMINOPHEN 325 MG PO TABS
650.0000 mg | ORAL_TABLET | Freq: Once | ORAL | Status: AC
  Administered 2021-10-27: 650 mg via ORAL
  Filled 2021-10-27: qty 2

## 2021-10-27 MED ORDER — LIDOCAINE-EPINEPHRINE-TETRACAINE (LET) TOPICAL GEL
3.0000 mL | Freq: Once | TOPICAL | Status: AC
  Administered 2021-10-27: 3 mL via TOPICAL
  Filled 2021-10-27: qty 3

## 2021-10-27 NOTE — ED Notes (Signed)
Patient transported to X-ray 

## 2021-10-27 NOTE — ED Triage Notes (Signed)
Pt was walking in house and tripped and fell onto floor. Hit face on floor. Laceration and swelling to nose and upper lip. C/o headache and bilateral knee pain. Ambulatory wit walker to triage. Denies LOC or thinners.

## 2021-10-27 NOTE — ED Notes (Signed)
LET applied to superior portion of the upper lip.

## 2021-10-27 NOTE — ED Provider Notes (Signed)
[facility] Provider Note   CSN: [id number] Arrival date & time: 10/27/21  1104     History  Chief Complaint  Patient presents with   Debra Hudson is a 86 y.o. female.  The history is provided by the patient and medical records. No language interpreter was used.  Fall This is a new problem. The current episode started less than 1 hour ago. The problem occurs rarely. The problem has not changed since onset.Associated symptoms include headaches. Pertinent negatives include no chest pain, no abdominal pain and no shortness of breath. Nothing aggravates the symptoms. Nothing relieves the symptoms. She has tried nothing for the symptoms. The treatment provided no relief.       Home Medications Prior to Admission medications   Medication Sig Start Date End Date Taking? Authorizing Provider  aspirin EC 81 MG tablet Take 81 mg by mouth daily.    [provider]  citalopram (CELEXA) 20 MG tablet Take 20 mg by mouth daily. 08/19/20   [provider]  furosemide (LASIX) 40 MG tablet Take 40 mg by mouth daily. 08/07/20   [provider]  latanoprost (XALATAN) 0.005 % ophthalmic solution 1 drop at bedtime. 07/04/20   [provider]  LORazepam (ATIVAN) 1 MG tablet Take 1 mg by mouth 2 (two) times daily as needed. 07/11/20   [provider]  losartan-hydrochlorothiazide (HYZAAR) 100-25 MG tablet Take 1 tablet by mouth daily. 05/01/15   [provider]  metFORMIN (GLUCOPHAGE-XR) 500 MG 24 hr tablet Take 500 mg by mouth 2 (two) times daily. 08/02/20   [provider]  metoprolol tartrate (LOPRESSOR) 25 MG tablet Take 25 mg by mouth 2 (two) times daily. 04/15/15   [provider]  ondansetron (ZOFRAN) 4 MG tablet Take 1 tablet (4 mg total) by mouth every 8 (eight) hours as needed. 09/25/20   Hyatt, Max T, DPM  ondansetron (ZOFRAN) 4 MG tablet Take 1 tablet (4 mg total) by mouth every 8 (eight)  hours as needed. 11/27/20   Hyatt, Max T, DPM  potassium chloride (MICRO-K) 10 MEQ CR capsule Take 10 mEq by mouth daily. 03/12/15   [provider]  SIMBRINZA 1-0.2 % SUSP Apply 1 drop to eye 2 (two) times daily. 04/11/20   [provider]  simvastatin (ZOCOR) 40 MG tablet Take 1 tablet by mouth at bedtime.    [provider]  valsartan-hydrochlorothiazide (DIOVAN-HCT) 160-12.5 MG tablet Take 1 tablet by mouth daily. 06/19/20   [provider]      Allergies    Erythromycin, Lisinopril, and Sertraline hcl    Review of Systems   Review of Systems  Constitutional:  Negative for chills, fatigue and fever.  HENT:  Negative for congestion.   Respiratory:  Negative for cough, chest tightness, shortness of breath and wheezing.   Cardiovascular:  Negative for chest pain and palpitations.  Gastrointestinal:  Negative for abdominal pain, constipation, diarrhea, nausea and vomiting.  Genitourinary:  Negative for dysuria and flank pain.  Musculoskeletal:  Negative for back pain, neck pain and neck stiffness.  Skin:  Positive for wound.  Neurological:  Positive for headaches. Negative for dizziness, weakness, light-headedness and numbness.  Psychiatric/Behavioral:  Negative for agitation and confusion.   All other systems reviewed and are negative.   Physical Exam Updated Vital Signs BP 136/61   Pulse (!) 58   Temp 98.1 F (36.7 C) (Oral)   Resp 17   Ht '5\' 4"'$  (  1.626 m)   Wt 55.8 kg   SpO2 100%   BMI 21.11 kg/m  Physical Exam Vitals and nursing note reviewed.  Constitutional:      General: She is not in acute distress.    Appearance: She is well-developed. She is not ill-appearing, toxic-appearing or diaphoretic.  HENT:     Head: Abrasion, contusion and laceration present.      Comments: 3 small injuries to the face and lip.  1 small 1 similar laceration horizontally just above the vermilion border, 1 small laceration to the left upper lip that does  slightly cross vermilion border, and one inner laceration.  Wounds all repaired and washed.  No trismus and no malocclusion.  No foreign body seen.  Lip lined up well.    Nose: Signs of injury and nasal tenderness present. No nasal deformity, congestion or rhinorrhea.     Right Nostril: No epistaxis or septal hematoma.     Left Nostril: No epistaxis or septal hematoma.     Mouth/Throat:     Mouth: Lacerations present.     Dentition: Dental tenderness present. No dental abscesses.     Tongue: No lesions.     Pharynx: Oropharynx is clear.   Eyes:     Conjunctiva/sclera: Conjunctivae normal.  Cardiovascular:     Rate and Rhythm: Normal rate and regular rhythm.     Heart sounds: No murmur heard. Pulmonary:     Effort: Pulmonary effort is normal. No respiratory distress.     Breath sounds: Normal breath sounds. No wheezing, rhonchi or rales.  Chest:     Chest wall: No tenderness.  Abdominal:     Palpations: Abdomen is soft.     Tenderness: There is no abdominal tenderness. There is no guarding or rebound.  Musculoskeletal:        General: Tenderness present. No swelling.     Cervical back: Neck supple.     Right knee: Tenderness present.     Left knee: Tenderness present.       Legs:  Skin:    General: Skin is warm and dry.     Capillary Refill: Capillary refill takes less than 2 seconds.     Findings: No erythema or rash.  Neurological:     General: No focal deficit present.     Mental Status: She is alert.     Sensory: No sensory deficit.     Motor: No weakness.  Psychiatric:        Mood and Affect: Mood normal.     ED Results / Procedures / Treatments   Labs (all labs ordered are listed, but only abnormal results are displayed) Labs Reviewed  CBG MONITORING, ED - Abnormal; Notable for the following components:      Result Value   Glucose-Capillary 109 (*)    All other components within normal limits    EKG None  Radiology DG Knee Complete 4 Views  Left  Result Date: 10/27/2021 CLINICAL DATA:  86 year old female with fall EXAM: LEFT KNEE - COMPLETE 4+ VIEW COMPARISON:  None Available. FINDINGS: No evidence of fracture, dislocation, or joint effusion. No evidence of arthropathy or other focal bone abnormality. Soft tissues are unremarkable. IMPRESSION: Negative. Electronically Signed   By: Corrie Mckusick D.O.   On: 10/27/2021 15:03   DG Knee Complete 4 Views Right  Result Date: 10/27/2021 CLINICAL DATA:  Fall with right knee pain EXAM: RIGHT KNEE - COMPLETE 4+ VIEW COMPARISON:  None Available. FINDINGS: No fracture, joint effusion or  dislocation. No suspicious focal osseous lesions. No significant arthropathy. No radiopaque foreign bodies. IMPRESSION: No right knee fracture, joint effusion or malalignment. Electronically Signed   By: Ilona Sorrel M.D.   On: 10/27/2021 14:57   CT Maxillofacial Wo Contrast  Result Date: 10/27/2021 CLINICAL DATA:  Provided history: Facial trauma, blunt. EXAM: CT MAXILLOFACIAL WITHOUT CONTRAST TECHNIQUE: Multidetector CT imaging of the maxillofacial structures was performed. Multiplanar CT image reconstructions were also generated. RADIATION DOSE REDUCTION: This exam was performed according to the departmental dose-optimization program which includes automated exposure control, adjustment of the mA and/or kV according to patient size and/or use of iterative reconstruction technique. COMPARISON:  Same day head CT 10/27/2021. Maxillofacial CT 09/25/2020. FINDINGS: Osseous: Redemonstrated chronic fracture deformity of the right nasal bone. Suspected minimally displaced fracture of maxillary alveolus, involving the socket of the left maxillary medial incisor (for instance as seen on series 3, images 32 and 33) (series 9, images 39-41). A subtle nondisplaced fracture of the maxillary alveolus involving the socket of the right maxillary medial incisor is also questioned. No acute maxillofacial fracture is identified elsewhere.  Orbits: No acute orbital finding. Sinuses: Minimal mucosal thickening within the bilateral maxillary sinuses, and within the left ethmoid air cell. Soft tissues: Nasal and upper lip soft tissue swelling. Limited intracranial: No evidence of acute intracranial abnormality within the field of view. IMPRESSION: Suspected minimally displaced acute fracture of the maxillary alveolus, involving the socket of the LEFT maxillary medial incisor. A subtle nondisplaced acute fracture of the maxillary alveolus involving the socket of the RIGHT maxillary medial incisor is also questioned. Correlate with physical exam findings. Redemonstrated chronic fracture deformity of the right nasal bone. Nasal and upper lobe soft tissue swelling. Electronically Signed   By: Kellie Simmering D.O.   On: 10/27/2021 13:17   CT Head Wo Contrast  Result Date: 10/27/2021 CLINICAL DATA:  Trauma EXAM: CT HEAD WITHOUT CONTRAST TECHNIQUE: Contiguous axial images were obtained from the base of the skull through the vertex without intravenous contrast. RADIATION DOSE REDUCTION: This exam was performed according to the departmental dose-optimization program which includes automated exposure control, adjustment of the mA and/or kV according to patient size and/or use of iterative reconstruction technique. COMPARISON:  CT head 10/01/2020 FINDINGS: Brain: No evidence of acute infarction, hemorrhage, hydrocephalus, extra-axial collection or mass lesion/mass effect. Mineralization of the basal ganglia bilaterally. Vascular: No hyperdense vessel or unexpected calcification. Skull: Normal. Negative for fracture or focal lesion. Sinuses/Orbits: Right lens replacement Other: None. IMPRESSION: No CT evidence of intracranial injury. Electronically Signed   By: Marin Roberts M.D.   On: 10/27/2021 13:00    Procedures .Marland KitchenLaceration Repair  Date/Time: 10/27/2021 4:07 PM  Performed by: Courtney Paris, MD Authorized by: Courtney Paris, MD    Consent:    Consent obtained:  Verbal   Consent given by:  Patient   Risks, benefits, and alternatives were discussed: yes     Risks discussed:  Infection, pain, poor cosmetic result and poor wound healing   Alternatives discussed:  No treatment Universal protocol:    Immediately prior to procedure, a time out was called: yes     Patient identity confirmed:  Verbally with patient and hospital-assigned identification number Anesthesia:    Anesthesia method:  Topical application   Topical anesthetic:  LET Laceration details:    Location:  Face   Length (cm):  1   Depth (mm):  2 Pre-procedure details:    Preparation:  Patient was prepped and draped in usual  sterile fashion and imaging obtained to evaluate for foreign bodies Exploration:    Limited defect created (wound extended): no     Hemostasis achieved with:  LET   Imaging outcome: foreign body not noted     Wound exploration: wound explored through full range of motion and entire depth of wound visualized     Contaminated: no   Treatment:    Area cleansed with:  Chlorhexidine and saline   Irrigation solution:  Sterile saline   Visualized foreign bodies/material removed: no     Debridement:  None   Undermining:  None   Scar revision: no     Layers/structures repaired:  Deep subcutaneous Deep subcutaneous:    Suture size:  5-0   Suture material:  Vicryl   Suture technique:  Simple interrupted   Number of sutures:  2 Skin repair:    Repair method:  Sutures   Suture size:  5-0   Suture material:  Fast-absorbing gut   Suture technique:  Simple interrupted   Number of sutures:  3 Approximation:    Approximation:  Close Repair type:    Repair type:  Intermediate Post-procedure details:    Dressing:  Open (no dressing)   Procedure completion:  Tolerated     Medications Ordered in ED Medications  lidocaine-EPINEPHrine-tetracaine (LET) topical gel (3 mLs Topical Given 10/27/21 1416)  acetaminophen (TYLENOL) tablet 650  mg (650 mg Oral Given 10/27/21 1422)    ED Course/ Medical Decision Making/ A&P                           Medical Decision Making Amount and/or Complexity of Data Reviewed Radiology: ordered.  Risk OTC drugs. Prescription drug management.    Debra Hudson is a 86 y.o. female with a past medical history significant for diabetes, hypertension, and previous appendectomy who presents with facial injury.  According to patient, she was walking today when she tripped up on her shoes and fell forward hitting her face on the ground.  She did not lose consciousness but was slightly dazed.  She reports she does not take any blood thinners.  She reports she has broken her nose before and has swelling and injury to her upper mouth and nose.  Denies any vision changes or speech abnormalities.  Denies any chest pain, back pain, neck pain, or abdominal pain.  She does report pain in both of her knees after the fall but denies any numbness, tingling, or weakness of extremities.  Denies any neck pain for me.  On exam, lungs clear and chest nontender.  Abdomen nontender.  She has pain in both knees but has intact sensation, strength, and pulses distally.  Hips nontender.  Neck nontender.  Patient does have a small 1 cm laceration horizontally just above her vermilion border.  She has some nasal swelling but no laceration.  No nasal septal hematoma seen.  Normal extraocular movements.  Pupils symmetric and reactive with normal appearance otherwise.  Patient does have some injury to the inner part of her upper lip.  I suspect the connecting tether may have torn but am not seeing large evidence of deep laceration from the underside.  Initially does not appear to be a through and through injury.  Patient's upper central teeth are slightly mobile and tender.  Patient had CT head and face in triage that shows evidence of maxillary alveolar fractures near the top 2 central teeth.  They also see the soft tissue  swelling.   No other fracture seen.  No nasal septal hematoma seen.  CT head otherwise reassuring.  Patient reassured about the CT head.  I called and spoke with Dr. Don Broach with oral surgery who feels she does need to follow-up in clinic within the next 5 to 7 days with him.  He agreed with putting several absorbable sutures on the external part of the laceration but otherwise he will see the patient to determine if the inner part needs any other management.  He agreed with starting some antibiotics to prevent infection and close follow-up.  He said she could also follow-up with any oral surgeon around however his clinic is in [place].  Her laceration is separate from the lip so we will put a very small amount of let cream for anesthesia and will place the sutures after gentle washout.  Anticipate follow-up with oral surgery.  We will also get x-rays of both knees given the pain and tenderness.  Anticipate Ortho follow-up if images are reassuring.   X-rays are negative of the knees.  Lacerations were repaired without difficulty.  3 separate areas were repaired.  We discussed management, pain management, antibiotic management, and follow-up with oral surgery.  Patient agrees.  We had a shared decision-making conversation about more sutures however given patient's well appearance and improvement in appearance after sutures placed, we agreed to hold on further and she will follow-up with her oral surgeon.  She had no other questions or concerns and was discharged in good condition.          Final Clinical Impression(s) / ED Diagnoses Final diagnoses:  Fall, initial encounter  Lip laceration, initial encounter  Open fracture of maxilla, unspecified laterality, initial encounter ([place])    Rx / DC Orders ED Discharge Orders          Ordered    doxycycline (VIBRAMYCIN) 100 MG capsule  2 times daily        10/27/21 1542           Clinical Impression: 1. Fall, initial encounter   2.  Lip laceration, initial encounter   3. Open fracture of maxilla, unspecified laterality, initial encounter ([place])     Disposition: Discharge  Condition: Good  I have discussed the results, Dx and Tx plan with the pt(& family if present). He/she/they expressed understanding and agree(s) with the plan. Discharge instructions discussed at great length. Strict return precautions discussed and pt &/or family have verbalized understanding of the instructions. No further questions at time of discharge.    New Prescriptions   DOXYCYCLINE (VIBRAMYCIN) 100 MG CAPSULE    Take 1 capsule (100 mg total) by mouth 2 (two) times daily.    Follow Up: Gardner Candle, DMD [address] [place] [address] [phone]     [facility] [address] [id number] YY FRTM [place] [phone]       Malik Paar, Gwenyth Allegra, MD 10/27/21 [phone]

## 2021-10-27 NOTE — Discharge Instructions (Signed)
Your history, exam and work-up today revealed several fractures in the upper jaw and in the small lacerations.  We were able to repair the lacerations after washing.  Please keep the area clean and follow-up with oral surgery as we discussed.  Please take the antibiotics as requested.  Please rest and stay hydrated.  If any symptoms change or worsen acutely, please return to the nearest emergency department.

## 2021-10-28 DIAGNOSIS — F329 Major depressive disorder, single episode, unspecified: Secondary | ICD-10-CM | POA: Diagnosis not present

## 2021-10-28 DIAGNOSIS — K219 Gastro-esophageal reflux disease without esophagitis: Secondary | ICD-10-CM | POA: Diagnosis not present

## 2021-10-28 DIAGNOSIS — E782 Mixed hyperlipidemia: Secondary | ICD-10-CM | POA: Diagnosis not present

## 2021-10-28 DIAGNOSIS — M1990 Unspecified osteoarthritis, unspecified site: Secondary | ICD-10-CM | POA: Diagnosis not present

## 2021-10-28 DIAGNOSIS — E1169 Type 2 diabetes mellitus with other specified complication: Secondary | ICD-10-CM | POA: Diagnosis not present

## 2021-10-28 DIAGNOSIS — I10 Essential (primary) hypertension: Secondary | ICD-10-CM | POA: Diagnosis not present

## 2021-11-07 DIAGNOSIS — R519 Headache, unspecified: Secondary | ICD-10-CM | POA: Diagnosis not present

## 2021-11-07 DIAGNOSIS — R110 Nausea: Secondary | ICD-10-CM | POA: Diagnosis not present

## 2021-11-08 ENCOUNTER — Emergency Department (HOSPITAL_BASED_OUTPATIENT_CLINIC_OR_DEPARTMENT_OTHER)
Admission: EM | Admit: 2021-11-08 | Discharge: 2021-11-08 | Disposition: A | Discharge status: Home / Self Care | Payer: Medicare Other | Attending: Emergency Medicine | Admitting: Emergency Medicine

## 2021-11-08 ENCOUNTER — Emergency Department (HOSPITAL_BASED_OUTPATIENT_CLINIC_OR_DEPARTMENT_OTHER): Payer: Medicare Other

## 2021-11-08 ENCOUNTER — Other Ambulatory Visit: Payer: Self-pay

## 2021-11-08 ENCOUNTER — Encounter (HOSPITAL_BASED_OUTPATIENT_CLINIC_OR_DEPARTMENT_OTHER): Payer: Self-pay | Admitting: Emergency Medicine

## 2021-11-08 DIAGNOSIS — S0990XA Unspecified injury of head, initial encounter: Secondary | ICD-10-CM | POA: Diagnosis present

## 2021-11-08 DIAGNOSIS — S060X0A Concussion without loss of consciousness, initial encounter: Secondary | ICD-10-CM | POA: Diagnosis not present

## 2021-11-08 DIAGNOSIS — E876 Hypokalemia: Secondary | ICD-10-CM | POA: Diagnosis not present

## 2021-11-08 DIAGNOSIS — Z7982 Long term (current) use of aspirin: Secondary | ICD-10-CM | POA: Diagnosis not present

## 2021-11-08 DIAGNOSIS — R519 Headache, unspecified: Secondary | ICD-10-CM | POA: Diagnosis not present

## 2021-11-08 DIAGNOSIS — W01198A Fall on same level from slipping, tripping and stumbling with subsequent striking against other object, initial encounter: Secondary | ICD-10-CM | POA: Diagnosis not present

## 2021-11-08 DIAGNOSIS — M5020 Other cervical disc displacement, unspecified cervical region: Secondary | ICD-10-CM | POA: Diagnosis not present

## 2021-11-08 LAB — COMPREHENSIVE METABOLIC PANEL
ALT: 16 U/L (ref 0–44)
AST: 28 U/L (ref 15–41)
Albumin: 3.8 g/dL (ref 3.5–5.0)
Alkaline Phosphatase: 62 U/L (ref 38–126)
Anion gap: 12 (ref 5–15)
BUN: 29 mg/dL — ABNORMAL HIGH (ref 8–23)
CO2: 29 mmol/L (ref 22–32)
Calcium: 9.1 mg/dL (ref 8.9–10.3)
Chloride: 97 mmol/L — ABNORMAL LOW (ref 98–111)
Creatinine, Ser: 1.02 mg/dL — ABNORMAL HIGH (ref 0.44–1.00)
GFR, Estimated: 55 mL/min — ABNORMAL LOW (ref >60)
Glucose, Bld: 125 mg/dL — ABNORMAL HIGH (ref 70–99)
Potassium: 2.7 mmol/L — CL (ref 3.5–5.1)
Sodium: 138 mmol/L (ref 135–145)
Total Bilirubin: 0.5 mg/dL (ref 0.3–1.2)
Total Protein: 6.9 g/dL (ref 6.5–8.1)

## 2021-11-08 LAB — CBC WITH DIFFERENTIAL/PLATELET
Abs Immature Granulocytes: 0.01 10*3/uL (ref 0.00–0.07)
Basophils Absolute: 0 10*3/uL (ref 0.0–0.1)
Basophils Relative: 0 %
Eosinophils Absolute: 0.2 10*3/uL (ref 0.0–0.5)
Eosinophils Relative: 3 %
HCT: 37.3 % (ref 36.0–46.0)
Hemoglobin: 12.5 g/dL (ref 12.0–15.0)
Immature Granulocytes: 0 %
Lymphocytes Relative: 37 %
Lymphs Abs: 3.4 10*3/uL (ref 0.7–4.0)
MCH: 29.4 pg (ref 26.0–34.0)
MCHC: 33.5 g/dL (ref 30.0–36.0)
MCV: 87.8 fL (ref 80.0–100.0)
Monocytes Absolute: 0.6 10*3/uL (ref 0.1–1.0)
Monocytes Relative: 6 %
Neutro Abs: 4.9 10*3/uL (ref 1.7–7.7)
Neutrophils Relative %: 54 %
Platelets: 255 10*3/uL (ref 150–400)
RBC: 4.25 MIL/uL (ref 3.87–5.11)
RDW: 13.5 % (ref 11.5–15.5)
WBC: 9.1 10*3/uL (ref 4.0–10.5)
nRBC: 0 % (ref 0.0–0.2)

## 2021-11-08 LAB — MAGNESIUM: Magnesium: 1.7 mg/dL (ref 1.7–2.4)

## 2021-11-08 LAB — BASIC METABOLIC PANEL
Anion gap: 7 (ref 5–15)
BUN: 24 mg/dL — ABNORMAL HIGH (ref 8–23)
CO2: 27 mmol/L (ref 22–32)
Calcium: 8.2 mg/dL — ABNORMAL LOW (ref 8.9–10.3)
Chloride: 102 mmol/L (ref 98–111)
Creatinine, Ser: 0.97 mg/dL (ref 0.44–1.00)
GFR, Estimated: 58 mL/min — ABNORMAL LOW (ref >60)
Glucose, Bld: 93 mg/dL (ref 70–99)
Potassium: 3.6 mmol/L (ref 3.5–5.1)
Sodium: 136 mmol/L (ref 135–145)

## 2021-11-08 MED ORDER — POTASSIUM CHLORIDE 10 MEQ/100ML IV SOLN
10.0000 meq | INTRAVENOUS | Status: AC
  Administered 2021-11-08 (×2): 10 meq via INTRAVENOUS
  Filled 2021-11-08 (×2): qty 100

## 2021-11-08 MED ORDER — ONDANSETRON HCL 4 MG/2ML IJ SOLN
4.0000 mg | Freq: Once | INTRAMUSCULAR | Status: AC
  Administered 2021-11-08: 4 mg via INTRAVENOUS
  Filled 2021-11-08: qty 2

## 2021-11-08 MED ORDER — POTASSIUM CHLORIDE CRYS ER 20 MEQ PO TBCR
40.0000 meq | EXTENDED_RELEASE_TABLET | Freq: Once | ORAL | Status: AC
  Administered 2021-11-08: 40 meq via ORAL
  Filled 2021-11-08: qty 2

## 2021-11-08 MED ORDER — SODIUM CHLORIDE 0.9 % IV BOLUS
1000.0000 mL | Freq: Once | INTRAVENOUS | Status: AC
  Administered 2021-11-08: 1000 mL via INTRAVENOUS

## 2021-11-08 MED ORDER — KETOROLAC TROMETHAMINE 30 MG/ML IJ SOLN
15.0000 mg | Freq: Once | INTRAMUSCULAR | Status: AC
  Administered 2021-11-08: 15 mg via INTRAVENOUS
  Filled 2021-11-08: qty 1

## 2021-11-08 NOTE — ED Notes (Signed)
Pt d/c home with daughter per MD order. Discharge summary reviewed, pt verbalizes understanding. No s/s of acute distress noted at discharge.

## 2021-11-08 NOTE — ED Notes (Signed)
Patient transported to CT 

## 2021-11-08 NOTE — Discharge Instructions (Addendum)
Take 2 of your potassium tablets (35mq) for the next 3 days instead of 1.  After that go back to taking it as directed.  You need to follow-up with your primary care doctor for recheck and they also need to recheck your potassium level and your EKG to make sure your QT interval is normal.  Return to the emergency room if you have any worsening symptoms.

## 2021-11-08 NOTE — ED Triage Notes (Signed)
Pt reports she had a fall on 9/25. Reports ongoing headache and nausea since then. Went to PCP for this yesterday and was given zofran, but says medication is not helping. No new confusion.

## 2021-11-08 NOTE — ED Provider Notes (Signed)
[facility] Provider Note   CSN: [id number] Arrival date & time: 11/08/21  1642     History  Chief Complaint  Patient presents with   Headache    BRITTA LOUTH is a 86 y.o. female.  Patient is a 86 year old female who presents with a headache and nausea.  She had a head injury on September 25.  She had tripped and fallen at that point and hit her face.  She had a headache after that but it has continued to get worse over the last several days.  She has ongoing nausea.  She did have a head CT on that date as well as CT maxillofacial bones.  She was noted to have some alveolar fractures of her front upper teeth.  These were recently removed by oral surgery.  She overall is doing well except for the worsening headache and nausea.  She has been using Tylenol without improvement in symptoms.  Occasionally she will take an ibuprofen.  She was given a prescription for Zofran from her PCP yesterday but does not seem to be helping.  She has some tingling in her hands and her feet yesterday bilaterally but no weakness or numbness.  No persistent symptoms.  She denies any weakness or numbness to her extremities currently.  No significant dizziness.  No difficulty with her balance.       Home Medications Prior to Admission medications   Medication Sig Start Date End Date Taking? Authorizing Provider  aspirin EC 81 MG tablet Take 81 mg by mouth daily.    [provider]  citalopram (CELEXA) 20 MG tablet Take 20 mg by mouth daily. 08/19/20   [provider]  doxycycline (VIBRAMYCIN) 100 MG capsule Take 1 capsule (100 mg total) by mouth 2 (two) times daily. 10/27/21   Tegeler, Gwenyth Allegra, MD  furosemide (LASIX) 40 MG tablet Take 40 mg by mouth daily. 08/07/20   [provider]  latanoprost (XALATAN) 0.005 % ophthalmic solution 1 drop at bedtime. 07/04/20   [provider]  LORazepam (ATIVAN) 1 MG tablet Take 1 mg by mouth 2 (two)  times daily as needed. 07/11/20   [provider]  losartan-hydrochlorothiazide (HYZAAR) 100-25 MG tablet Take 1 tablet by mouth daily. 05/01/15   [provider]  metFORMIN (GLUCOPHAGE-XR) 500 MG 24 hr tablet Take 500 mg by mouth 2 (two) times daily. 08/02/20   [provider]  metoprolol tartrate (LOPRESSOR) 25 MG tablet Take 25 mg by mouth 2 (two) times daily. 04/15/15   [provider]  ondansetron (ZOFRAN) 4 MG tablet Take 1 tablet (4 mg total) by mouth every 8 (eight) hours as needed. 09/25/20   Hyatt, Max T, DPM  ondansetron (ZOFRAN) 4 MG tablet Take 1 tablet (4 mg total) by mouth every 8 (eight) hours as needed. 11/27/20   Hyatt, Max T, DPM  potassium chloride (MICRO-K) 10 MEQ CR capsule Take 10 mEq by mouth daily. 03/12/15   [provider]  SIMBRINZA 1-0.2 % SUSP Apply 1 drop to eye 2 (two) times daily. 04/11/20   [provider]  simvastatin (ZOCOR) 40 MG tablet Take 1 tablet by mouth at bedtime.    [provider]  valsartan-hydrochlorothiazide (DIOVAN-HCT) 160-12.5 MG tablet Take 1 tablet by mouth daily. 06/19/20   [provider]      Allergies    Erythromycin, Lisinopril, and Sertraline hcl    Review of Systems   Review of Systems  Constitutional:  Negative for  chills, diaphoresis, fatigue and fever.  HENT:  Negative for congestion, rhinorrhea and sneezing.   Eyes: Negative.   Respiratory:  Negative for cough, chest tightness and shortness of breath.   Cardiovascular:  Negative for chest pain and leg swelling.  Gastrointestinal:  Positive for nausea. Negative for abdominal pain, blood in stool, diarrhea and vomiting.  Genitourinary:  Negative for difficulty urinating, flank pain, frequency and hematuria.  Musculoskeletal:  Negative for arthralgias and back pain.  Skin:  Negative for rash.  Neurological:  Positive for headaches. Negative for dizziness, speech difficulty, weakness and numbness.    Physical  Exam Updated Vital Signs BP (!) 154/64   Pulse (!) 53   Temp 97.7 F (36.5 C) (Oral)   Resp 14   SpO2 99%  Physical Exam Constitutional:      Appearance: She is well-developed.  HENT:     Head: Normocephalic and atraumatic.     Mouth/Throat:     Comments: Upper front teeth are missing.  There are sutures in place.  No swelling or signs of infection. Eyes:     Extraocular Movements: Extraocular movements intact.     Pupils: Pupils are equal, round, and reactive to light.  Neck:     Comments: Mild tenderness throughout the cervical spine.  No pain to the thoracic or lumbosacral spine Cardiovascular:     Rate and Rhythm: Normal rate and regular rhythm.     Heart sounds: Normal heart sounds.  Pulmonary:     Effort: Pulmonary effort is normal. No respiratory distress.     Breath sounds: Normal breath sounds. No wheezing or rales.  Chest:     Chest wall: No tenderness.  Abdominal:     General: Bowel sounds are normal.     Palpations: Abdomen is soft.     Tenderness: There is no abdominal tenderness. There is no guarding or rebound.  Musculoskeletal:        General: Normal range of motion.  Lymphadenopathy:     Cervical: No cervical adenopathy.  Skin:    General: Skin is warm and dry.     Findings: No rash.  Neurological:     Mental Status: She is alert and oriented to person, place, and time.     Comments: Motor 5/5 all extremities Sensation grossly intact to LT all extremities Finger to Nose intact, no pronator drift CN II-XII grossly intact       ED Results / Procedures / Treatments   Labs (all labs ordered are listed, but only abnormal results are displayed) Labs Reviewed  COMPREHENSIVE METABOLIC PANEL - Abnormal; Notable for the following components:      Result Value   Potassium 2.7 (*)    Chloride 97 (*)    Glucose, Bld 125 (*)    BUN 29 (*)    Creatinine, Ser 1.02 (*)    GFR, Estimated 55 (*)    All other components within normal limits  BASIC METABOLIC  PANEL - Abnormal; Notable for the following components:   BUN 24 (*)    Calcium 8.2 (*)    GFR, Estimated 58 (*)    All other components within normal limits  CBC WITH DIFFERENTIAL/PLATELET  MAGNESIUM    EKG EKG Interpretation  Date/Time:  Saturday November 08 2021 21:01:56 EDT Ventricular Rate:  55 PR Interval:  204 QRS Duration: 96 QT Interval:  496 QTC Calculation: 475 R Axis:   29 Text Interpretation: Sinus rhythm Abnormal R-wave progression, early transition Confirmed by Malvin Johns [phone]) on  11/08/2021 9:38:01 PM  Radiology CT Head Wo Contrast  Result Date: 11/08/2021 CLINICAL DATA:  Pt reports she had a fall on 9/25. Reports ongoing headache and nausea since then. Went to PCP for this yesterday and was given zofran, but says medication is not helping. No new confusion. EXAM: CT HEAD WITHOUT CONTRAST CT CERVICAL SPINE WITHOUT CONTRAST TECHNIQUE: Multidetector CT imaging of the head and cervical spine was performed following the standard protocol without intravenous contrast. Multiplanar CT image reconstructions of the cervical spine were also generated. RADIATION DOSE REDUCTION: This exam was performed according to the departmental dose-optimization program which includes automated exposure control, adjustment of the mA and/or kV according to patient size and/or use of iterative reconstruction technique. COMPARISON:  10/01/2020.  Head CT and cervical spine CT, 09/25/2020. FINDINGS: CT HEAD FINDINGS Brain: No evidence of acute infarction, hemorrhage, hydrocephalus, extra-axial collection or mass lesion/mass effect. Mild ventricular sulcal enlargement reflecting age-appropriate volume loss. Vascular: No hyperdense vessel or unexpected calcification. Skull: Normal. Negative for fracture or focal lesion. Sinuses/Orbits: Normal globes and orbits. Visualized sinuses are clear. Other: None. CT CERVICAL SPINE FINDINGS Alignment: Mild reversal of the normal cervical lordosis. No  spondylolisthesis. Skull base and vertebrae: No acute fracture. No primary bone lesion or focal pathologic process. Soft tissues and spinal canal: No prevertebral fluid or swelling. No visible canal hematoma. Disc levels: Mild loss of disc height at C4-C5 and C5-C6. Moderate loss of disc height at C6-C7. Mild disc bulging with endplate spurring. No evidence of a disc herniation. Significant facet degenerative changes, greater on the right. Upper chest: No acute findings. Heterogeneous thyroid gland. This has been previously assessed with ultrasound and nuclear medicine imaging. Other: None. IMPRESSION: HEAD CT 1. Normal CT scan of the brain without contrast for age. CERVICAL CT 1. No fracture or acute finding. Electronically Signed   By: Lajean Manes M.D.   On: 11/08/2021 18:08   CT Cervical Spine Wo Contrast  Result Date: 11/08/2021 CLINICAL DATA:  Pt reports she had a fall on 9/25. Reports ongoing headache and nausea since then. Went to PCP for this yesterday and was given zofran, but says medication is not helping. No new confusion. EXAM: CT HEAD WITHOUT CONTRAST CT CERVICAL SPINE WITHOUT CONTRAST TECHNIQUE: Multidetector CT imaging of the head and cervical spine was performed following the standard protocol without intravenous contrast. Multiplanar CT image reconstructions of the cervical spine were also generated. RADIATION DOSE REDUCTION: This exam was performed according to the departmental dose-optimization program which includes automated exposure control, adjustment of the mA and/or kV according to patient size and/or use of iterative reconstruction technique. COMPARISON:  10/01/2020.  Head CT and cervical spine CT, 09/25/2020. FINDINGS: CT HEAD FINDINGS Brain: No evidence of acute infarction, hemorrhage, hydrocephalus, extra-axial collection or mass lesion/mass effect. Mild ventricular sulcal enlargement reflecting age-appropriate volume loss. Vascular: No hyperdense vessel or unexpected  calcification. Skull: Normal. Negative for fracture or focal lesion. Sinuses/Orbits: Normal globes and orbits. Visualized sinuses are clear. Other: None. CT CERVICAL SPINE FINDINGS Alignment: Mild reversal of the normal cervical lordosis. No spondylolisthesis. Skull base and vertebrae: No acute fracture. No primary bone lesion or focal pathologic process. Soft tissues and spinal canal: No prevertebral fluid or swelling. No visible canal hematoma. Disc levels: Mild loss of disc height at C4-C5 and C5-C6. Moderate loss of disc height at C6-C7. Mild disc bulging with endplate spurring. No evidence of a disc herniation. Significant facet degenerative changes, greater on the right. Upper chest: No acute findings. Heterogeneous thyroid  gland. This has been previously assessed with ultrasound and nuclear medicine imaging. Other: None. IMPRESSION: HEAD CT 1. Normal CT scan of the brain without contrast for age. CERVICAL CT 1. No fracture or acute finding. Electronically Signed   By: Lajean Manes M.D.   On: 11/08/2021 18:08    Procedures Procedures    Medications Ordered in ED Medications  sodium chloride 0.9 % bolus 1,000 mL (0 mLs Intravenous Stopped 11/08/21 2110)  ondansetron (ZOFRAN) injection 4 mg (4 mg Intravenous Given 11/08/21 1728)  potassium chloride 10 mEq in 100 mL IVPB (0 mEq Intravenous Stopped 11/08/21 2110)  potassium chloride SA (KLOR-CON M) CR tablet 40 mEq (40 mEq Oral Given 11/08/21 1831)  ketorolac (TORADOL) 30 MG/ML injection 15 mg (15 mg Intravenous Given 11/08/21 1946)    ED Course/ Medical Decision Making/ A&P                           Medical Decision Making Amount and/or Complexity of Data Reviewed Labs: ordered. Radiology: ordered.  Risk Prescription drug management.   Patient is a 86 year old who presents with a headache and nausea after having a head injury on September 25.  She does not have any focal neurologic deficits.  No vomiting.  She had a head CT and a CT of her  cervical spine given that this was not done on the original visit and she does have some neck pain.  These images were reviewed by myself and interpreted by the radiologist to show no evidence of intracranial hemorrhage or acute bony injury.  She had labs which showed a marked hypokalemia with a potassium of 2.7.  Magnesium was normal.  She was started on IV and oral potassium replacement.  EKG was performed.  No ischemic changes are noted but she does have a slightly prolonged QT interval.  Zofran had already been given when this was obtained.  Following the potassium replacement, repeat BMP was performed.  Her potassium has normalized.  Repeat EKG shows a normal QT interval.  She was given IV fluids and Toradol and Zofran in the ED.  Overall she is feeling better.  She still has a little bit of a headache but overall feels better.  She is able to eat and drink without ongoing nausea or vomiting.  At this point given her improving symptoms and lack of other findings, I feel that hospitalization is not indicated.  She was discharged home in good condition.  She was advised to follow-up closely with her primary care doctor to recheck her potassium and EKG.  At this point I advised her that she can use Zofran sparingly and have her doctor recheck an EKG to make sure she does not have any QT prolongation.  She does take potassium at home along with her Lasix.  Was advised to take 20 mill equivalents daily for the next 3 days and then go back to 10 mill equivalents.  Return precautions were given.  Final Clinical Impression(s) / ED Diagnoses Final diagnoses:  Concussion without loss of consciousness, initial encounter  Hypokalemia    Rx / DC Orders ED Discharge Orders     None         Malvin Johns, MD 11/08/21 2212

## 2021-11-10 ENCOUNTER — Ambulatory Visit: Payer: Medicare Other | Admitting: Physical Therapy

## 2021-11-17 DIAGNOSIS — R9431 Abnormal electrocardiogram [ECG] [EKG]: Secondary | ICD-10-CM | POA: Diagnosis not present

## 2021-11-17 DIAGNOSIS — R519 Headache, unspecified: Secondary | ICD-10-CM | POA: Diagnosis not present

## 2021-11-17 DIAGNOSIS — F324 Major depressive disorder, single episode, in partial remission: Secondary | ICD-10-CM | POA: Diagnosis not present

## 2021-11-17 DIAGNOSIS — Z23 Encounter for immunization: Secondary | ICD-10-CM | POA: Diagnosis not present

## 2021-11-17 DIAGNOSIS — E876 Hypokalemia: Secondary | ICD-10-CM | POA: Diagnosis not present

## 2021-11-17 DIAGNOSIS — R110 Nausea: Secondary | ICD-10-CM | POA: Diagnosis not present

## 2021-12-04 DIAGNOSIS — Z23 Encounter for immunization: Secondary | ICD-10-CM | POA: Diagnosis not present

## 2021-12-17 DIAGNOSIS — H25812 Combined forms of age-related cataract, left eye: Secondary | ICD-10-CM | POA: Diagnosis not present

## 2021-12-17 DIAGNOSIS — H401222 Low-tension glaucoma, left eye, moderate stage: Secondary | ICD-10-CM | POA: Diagnosis not present

## 2021-12-17 DIAGNOSIS — E119 Type 2 diabetes mellitus without complications: Secondary | ICD-10-CM | POA: Diagnosis not present

## 2021-12-17 DIAGNOSIS — H401211 Low-tension glaucoma, right eye, mild stage: Secondary | ICD-10-CM | POA: Diagnosis not present

## 2022-02-05 DIAGNOSIS — F329 Major depressive disorder, single episode, unspecified: Secondary | ICD-10-CM | POA: Diagnosis not present

## 2022-02-05 DIAGNOSIS — R634 Abnormal weight loss: Secondary | ICD-10-CM | POA: Diagnosis not present

## 2022-02-17 DIAGNOSIS — R300 Dysuria: Secondary | ICD-10-CM | POA: Diagnosis not present

## 2022-03-18 DIAGNOSIS — R300 Dysuria: Secondary | ICD-10-CM | POA: Diagnosis not present

## 2022-04-23 DIAGNOSIS — K219 Gastro-esophageal reflux disease without esophagitis: Secondary | ICD-10-CM | POA: Diagnosis not present

## 2022-04-23 DIAGNOSIS — Z0000 Encounter for general adult medical examination without abnormal findings: Secondary | ICD-10-CM | POA: Diagnosis not present

## 2022-04-23 DIAGNOSIS — E782 Mixed hyperlipidemia: Secondary | ICD-10-CM | POA: Diagnosis not present

## 2022-04-23 DIAGNOSIS — F324 Major depressive disorder, single episode, in partial remission: Secondary | ICD-10-CM | POA: Diagnosis not present

## 2022-04-23 DIAGNOSIS — Z79899 Other long term (current) drug therapy: Secondary | ICD-10-CM | POA: Diagnosis not present

## 2022-04-23 DIAGNOSIS — E1169 Type 2 diabetes mellitus with other specified complication: Secondary | ICD-10-CM | POA: Diagnosis not present

## 2022-04-23 DIAGNOSIS — I10 Essential (primary) hypertension: Secondary | ICD-10-CM | POA: Diagnosis not present

## 2022-04-23 DIAGNOSIS — G629 Polyneuropathy, unspecified: Secondary | ICD-10-CM | POA: Diagnosis not present

## 2022-05-12 DIAGNOSIS — H25812 Combined forms of age-related cataract, left eye: Secondary | ICD-10-CM | POA: Diagnosis not present

## 2022-05-12 DIAGNOSIS — H401211 Low-tension glaucoma, right eye, mild stage: Secondary | ICD-10-CM | POA: Diagnosis not present

## 2022-05-19 ENCOUNTER — Ambulatory Visit (INDEPENDENT_AMBULATORY_CARE_PROVIDER_SITE_OTHER): Payer: Medicare Other | Admitting: Podiatry

## 2022-05-19 ENCOUNTER — Encounter: Payer: Self-pay | Admitting: Podiatry

## 2022-05-19 DIAGNOSIS — D2372 Other benign neoplasm of skin of left lower limb, including hip: Secondary | ICD-10-CM

## 2022-05-19 DIAGNOSIS — M2042 Other hammer toe(s) (acquired), left foot: Secondary | ICD-10-CM | POA: Diagnosis not present

## 2022-05-19 NOTE — Progress Notes (Signed)
She presents with her daughter today for chief concern of a painful distal aspect of her third toe.  She states that the big toe is putting so much pressure on the tip of the third toe that is causing a painful callus.  She has a history of amputation of the second digit.  Objective: Vital signs are stable she alert oriented x 3.  She presents today using a digital restrained interdigital spacer.  The third toe does demonstrate a very small distal clavus which I debrided today and I placed a silicone pad over the toe as well as the digital restraining interspace or.  Assessment: Hammertoe deformity distal clavus secondary to hallux valgus.  Plan: Placed padding.  Follow-up with me as needed.

## 2022-06-15 DIAGNOSIS — Z1231 Encounter for screening mammogram for malignant neoplasm of breast: Secondary | ICD-10-CM | POA: Diagnosis not present

## 2022-06-22 DIAGNOSIS — R300 Dysuria: Secondary | ICD-10-CM | POA: Diagnosis not present

## 2022-06-26 DIAGNOSIS — S39012A Strain of muscle, fascia and tendon of lower back, initial encounter: Secondary | ICD-10-CM | POA: Diagnosis not present

## 2022-06-26 DIAGNOSIS — N3000 Acute cystitis without hematuria: Secondary | ICD-10-CM | POA: Diagnosis not present

## 2022-07-02 DIAGNOSIS — R10814 Left lower quadrant abdominal tenderness: Secondary | ICD-10-CM | POA: Diagnosis not present

## 2022-07-02 DIAGNOSIS — R102 Pelvic and perineal pain: Secondary | ICD-10-CM | POA: Diagnosis not present

## 2022-07-02 DIAGNOSIS — Z87440 Personal history of urinary (tract) infections: Secondary | ICD-10-CM | POA: Diagnosis not present

## 2022-07-21 DIAGNOSIS — R300 Dysuria: Secondary | ICD-10-CM | POA: Diagnosis not present

## 2022-08-13 DIAGNOSIS — R1030 Lower abdominal pain, unspecified: Secondary | ICD-10-CM | POA: Diagnosis not present

## 2022-09-04 DIAGNOSIS — R300 Dysuria: Secondary | ICD-10-CM | POA: Diagnosis not present

## 2022-09-05 DIAGNOSIS — E876 Hypokalemia: Secondary | ICD-10-CM | POA: Diagnosis not present

## 2022-09-05 DIAGNOSIS — E042 Nontoxic multinodular goiter: Secondary | ICD-10-CM | POA: Diagnosis not present

## 2022-09-05 DIAGNOSIS — Z79899 Other long term (current) drug therapy: Secondary | ICD-10-CM | POA: Diagnosis not present

## 2022-09-05 DIAGNOSIS — I6529 Occlusion and stenosis of unspecified carotid artery: Secondary | ICD-10-CM | POA: Diagnosis not present

## 2022-09-05 DIAGNOSIS — Z7984 Long term (current) use of oral hypoglycemic drugs: Secondary | ICD-10-CM | POA: Diagnosis not present

## 2022-09-05 DIAGNOSIS — R519 Headache, unspecified: Secondary | ICD-10-CM | POA: Diagnosis not present

## 2022-09-05 DIAGNOSIS — G4489 Other headache syndrome: Secondary | ICD-10-CM | POA: Diagnosis not present

## 2022-09-05 DIAGNOSIS — E785 Hyperlipidemia, unspecified: Secondary | ICD-10-CM | POA: Diagnosis not present

## 2022-09-05 DIAGNOSIS — R001 Bradycardia, unspecified: Secondary | ICD-10-CM | POA: Diagnosis not present

## 2022-09-05 DIAGNOSIS — E1139 Type 2 diabetes mellitus with other diabetic ophthalmic complication: Secondary | ICD-10-CM | POA: Diagnosis not present

## 2022-09-05 DIAGNOSIS — I10 Essential (primary) hypertension: Secondary | ICD-10-CM | POA: Diagnosis not present

## 2022-09-05 DIAGNOSIS — R9431 Abnormal electrocardiogram [ECG] [EKG]: Secondary | ICD-10-CM | POA: Diagnosis not present

## 2022-09-05 DIAGNOSIS — H42 Glaucoma in diseases classified elsewhere: Secondary | ICD-10-CM | POA: Diagnosis not present

## 2022-09-15 DIAGNOSIS — H401222 Low-tension glaucoma, left eye, moderate stage: Secondary | ICD-10-CM | POA: Diagnosis not present

## 2022-09-15 DIAGNOSIS — H401211 Low-tension glaucoma, right eye, mild stage: Secondary | ICD-10-CM | POA: Diagnosis not present

## 2022-09-29 DIAGNOSIS — K219 Gastro-esophageal reflux disease without esophagitis: Secondary | ICD-10-CM | POA: Diagnosis not present

## 2022-09-29 DIAGNOSIS — E876 Hypokalemia: Secondary | ICD-10-CM | POA: Diagnosis not present

## 2022-09-29 DIAGNOSIS — F419 Anxiety disorder, unspecified: Secondary | ICD-10-CM | POA: Diagnosis not present

## 2022-09-29 DIAGNOSIS — R519 Headache, unspecified: Secondary | ICD-10-CM | POA: Diagnosis not present

## 2022-09-29 DIAGNOSIS — I10 Essential (primary) hypertension: Secondary | ICD-10-CM | POA: Diagnosis not present

## 2022-09-29 DIAGNOSIS — E1169 Type 2 diabetes mellitus with other specified complication: Secondary | ICD-10-CM | POA: Diagnosis not present

## 2022-09-29 DIAGNOSIS — M5030 Other cervical disc degeneration, unspecified cervical region: Secondary | ICD-10-CM | POA: Diagnosis not present

## 2022-10-19 DIAGNOSIS — Z23 Encounter for immunization: Secondary | ICD-10-CM | POA: Diagnosis not present

## 2022-11-11 DIAGNOSIS — I10 Essential (primary) hypertension: Secondary | ICD-10-CM | POA: Diagnosis not present

## 2022-11-11 DIAGNOSIS — G44209 Tension-type headache, unspecified, not intractable: Secondary | ICD-10-CM | POA: Diagnosis not present

## 2022-11-11 DIAGNOSIS — J019 Acute sinusitis, unspecified: Secondary | ICD-10-CM | POA: Diagnosis not present

## 2022-12-18 DIAGNOSIS — H43812 Vitreous degeneration, left eye: Secondary | ICD-10-CM | POA: Diagnosis not present

## 2023-03-24 DIAGNOSIS — R300 Dysuria: Secondary | ICD-10-CM | POA: Diagnosis not present

## 2023-03-26 DIAGNOSIS — R300 Dysuria: Secondary | ICD-10-CM | POA: Diagnosis not present

## 2023-04-02 DIAGNOSIS — M79605 Pain in left leg: Secondary | ICD-10-CM | POA: Diagnosis not present

## 2023-04-02 DIAGNOSIS — L0390 Cellulitis, unspecified: Secondary | ICD-10-CM | POA: Diagnosis not present

## 2023-04-02 DIAGNOSIS — R600 Localized edema: Secondary | ICD-10-CM | POA: Diagnosis not present

## 2023-04-05 ENCOUNTER — Ambulatory Visit (HOSPITAL_COMMUNITY)
Admission: RE | Admit: 2023-04-05 | Discharge: 2023-04-05 | Disposition: A | Discharge status: Home / Self Care | Payer: Medicare Other | Source: Ambulatory Visit | Attending: Cardiology | Admitting: Cardiology

## 2023-04-05 ENCOUNTER — Other Ambulatory Visit (HOSPITAL_COMMUNITY): Payer: Self-pay | Admitting: Internal Medicine

## 2023-04-05 DIAGNOSIS — R600 Localized edema: Secondary | ICD-10-CM | POA: Insufficient documentation

## 2023-04-08 DIAGNOSIS — R600 Localized edema: Secondary | ICD-10-CM | POA: Diagnosis not present

## 2023-04-08 DIAGNOSIS — L0390 Cellulitis, unspecified: Secondary | ICD-10-CM | POA: Diagnosis not present

## 2023-04-08 DIAGNOSIS — M79605 Pain in left leg: Secondary | ICD-10-CM | POA: Diagnosis not present

## 2023-04-08 DIAGNOSIS — G629 Polyneuropathy, unspecified: Secondary | ICD-10-CM | POA: Diagnosis not present

## 2023-04-26 DIAGNOSIS — R4189 Other symptoms and signs involving cognitive functions and awareness: Secondary | ICD-10-CM | POA: Diagnosis not present

## 2023-04-26 DIAGNOSIS — F324 Major depressive disorder, single episode, in partial remission: Secondary | ICD-10-CM | POA: Diagnosis not present

## 2023-04-26 DIAGNOSIS — I10 Essential (primary) hypertension: Secondary | ICD-10-CM | POA: Diagnosis not present

## 2023-04-26 DIAGNOSIS — E1169 Type 2 diabetes mellitus with other specified complication: Secondary | ICD-10-CM | POA: Diagnosis not present

## 2023-04-26 DIAGNOSIS — F419 Anxiety disorder, unspecified: Secondary | ICD-10-CM | POA: Diagnosis not present

## 2023-04-26 DIAGNOSIS — E782 Mixed hyperlipidemia: Secondary | ICD-10-CM | POA: Diagnosis not present

## 2023-04-26 DIAGNOSIS — H9190 Unspecified hearing loss, unspecified ear: Secondary | ICD-10-CM | POA: Diagnosis not present

## 2023-04-26 DIAGNOSIS — Z0000 Encounter for general adult medical examination without abnormal findings: Secondary | ICD-10-CM | POA: Diagnosis not present

## 2023-04-26 DIAGNOSIS — G629 Polyneuropathy, unspecified: Secondary | ICD-10-CM | POA: Diagnosis not present

## 2023-04-26 DIAGNOSIS — K219 Gastro-esophageal reflux disease without esophagitis: Secondary | ICD-10-CM | POA: Diagnosis not present

## 2023-04-26 DIAGNOSIS — Z1331 Encounter for screening for depression: Secondary | ICD-10-CM | POA: Diagnosis not present

## 2023-04-26 DIAGNOSIS — R2689 Other abnormalities of gait and mobility: Secondary | ICD-10-CM | POA: Diagnosis not present

## 2023-04-26 DIAGNOSIS — Z79899 Other long term (current) drug therapy: Secondary | ICD-10-CM | POA: Diagnosis not present

## 2023-04-26 DIAGNOSIS — H6121 Impacted cerumen, right ear: Secondary | ICD-10-CM | POA: Diagnosis not present

## 2023-04-28 ENCOUNTER — Other Ambulatory Visit: Payer: Self-pay

## 2023-04-28 ENCOUNTER — Ambulatory Visit: Attending: Internal Medicine

## 2023-04-28 DIAGNOSIS — R293 Abnormal posture: Secondary | ICD-10-CM | POA: Diagnosis not present

## 2023-04-28 DIAGNOSIS — R2681 Unsteadiness on feet: Secondary | ICD-10-CM | POA: Insufficient documentation

## 2023-04-28 DIAGNOSIS — M6281 Muscle weakness (generalized): Secondary | ICD-10-CM | POA: Diagnosis not present

## 2023-04-28 NOTE — Therapy (Signed)
 OUTPATIENT PHYSICAL THERAPY LOWER EXTREMITY EVALUATION   Patient Name: Debra Hudson MRN: [id number] DOB:1935/12/25, 88 y.o., female Today's Date: 04/28/2023  END OF SESSION:  PT End of Session - 04/28/23 1753     Visit Number 1    Date for PT Re-Evaluation 06/23/23    Progress Note Due on Visit 10    PT Start Time 1300    PT Stop Time 1345    PT Time Calculation (min) 45 min    Activity Tolerance Patient tolerated treatment well    Behavior During Therapy WFL for tasks assessed/performed             Past Medical History:  Diagnosis Date   Diabetes mellitus without complication (HCC)    Hypertension    Past Surgical History:  Procedure Laterality Date   APPENDECTOMY     FOOT SURGERY Left    2 toe removed   Patient Active Problem List   Diagnosis Date Noted   Urinary urgency 08/22/2020   Anxiety 08/22/2020   Atrophic vaginitis 08/22/2020   Dysuria 08/22/2020   Female stress incontinence 08/22/2020   Closed fracture of right side of maxilla with routine healing 05/21/2020    PCP: Hillard Danker, MD  REFERRING PROVIDER: same  REFERRING DIAG: unsteady gait  THERAPY DIAG:  Unsteadiness on feet  Abnormal posture  Muscle weakness (generalized)  Rationale for Evaluation and Treatment: Rehabilitation  ONSET DATE: chronic, worse last year  SUBJECTIVE:   SUBJECTIVE STATEMENT: My walking has gotten a little worse, more unsteady, using the cane whenever I got out  PERTINENT HISTORY: H/O balance deficits, unsteady gait, large fall 2 yrs ago.  PAIN:  Are you having pain? No  PRECAUTIONS: Fall  RED FLAGS: None   WEIGHT BEARING RESTRICTIONS: No  FALLS:  Has patient fallen in last 6 months? No  LIVING ENVIRONMENT: Lives with: lives with their family Lives in: House/apartment Stairs: No Has following equipment at home: Single point cane  OCCUPATION: retired  PLOF: Independent  PATIENT GOALS: get more mobile, steady on my feet   NEXT MD  VISIT: 3 months  OBJECTIVE:  Note: Objective measures were completed at Evaluation unless otherwise noted.  DIAGNOSTIC FINDINGS: na    COGNITION: Overall cognitive status: Within functional limits for tasks assessed     SENSATION: Reports intermittent neuropathy Sx L lower leg    POSTURE: scoliotic posture with L trunk / lumbar concavity and maintains cervical spine in L side bent position  PALPATION: na  LOWER EXTREMITY ROM: grossly wfl  Active ROM Right eval Left eval  Hip flexion    Hip extension    Hip abduction    Hip adduction    Hip internal rotation    Hip external rotation    Knee flexion    Knee extension    Ankle dorsiflexion    Ankle plantarflexion    Ankle inversion    Ankle eversion     (Blank rows = not tested)  LOWER EXTREMITY MMT:  MMT Right eval Left eval  Hip flexion 4 4-  Hip extension 4- 4-  Hip abduction 4- 3+  Hip adduction    Hip internal rotation    Hip external rotation    Knee flexion 4 4-  Knee extension 4- 4-  Ankle dorsiflexion 4- 4-  Ankle plantarflexion 3+ 3+  Ankle inversion    Ankle eversion     (Blank rows = not tested)  FUNCTIONAL TESTS:  Timed up and go (TUG): without cane: 17.07 ,  with cane 13.79 BERG:34/56   GAIT: Distance walked: 600' in 5 min 9 sec Assistive device utilized: Single point cane Level of assistance: CGA Comments: slower gait, increased sway out of path noted at 450'                                                                                                                                TREATMENT DATE: 04/28/23:  evaluation   PATIENT EDUCATION:  Education details: POC, goals Person educated: Patient Education method: Explanation, Demonstration, and Tactile cues Education comprehension: verbalized understanding, returned demonstration, verbal cues required, and tactile cues required  HOME EXERCISE PROGRAM: TBD  ASSESSMENT:  CLINICAL IMPRESSION: Patient is a 88 y.o. female  who was evaluated today by physical therapy for recent decline in her balance, gait. She has lost her husband within the last 2 years and has moved into an addition at her daughter's home, has become less active.  Concerned about falling, wants to improve her overall endurance and balance.  Scored 34/56 on Berg test indicating higher risk of falls, also TUG without device, greater than 14 sec.  She does not typically use device in the home.  Will benefit from skilled PT to address her deficits and improve her overall physical function, safety  OBJECTIVE IMPAIRMENTS: decreased activity tolerance, decreased balance, difficulty walking, decreased strength, impaired perceived functional ability, and postural dysfunction.   ACTIVITY LIMITATIONS: carrying, lifting, standing, stairs, transfers, and locomotion level  PARTICIPATION LIMITATIONS: meal prep, cleaning, laundry, shopping, community activity, and yard work  PERSONAL FACTORS: Age, Fitness, Past/current experiences, Time since onset of injury/illness/exacerbation, and 1-2 comorbidities: peripheral neuropathy, depression  are also affecting patient's functional outcome.   REHAB POTENTIAL: Good  CLINICAL DECISION MAKING: Evolving/moderate complexity  EVALUATION COMPLEXITY: Moderate   GOALS: Goals reviewed with patient? Yes  SHORT TERM GOALS: Target date: 2 weeks 4/9//25 I HEP Baseline: Goal status: INITIAL   LONG TERM GOALS: Target date: 06/23/23, 8 weeks  Berg 34/56 improve to 45/56 or greater Baseline:  Goal status: INITIAL  2.  TUG without device 14 sec or less Baseline: 17.07 Goal status: INITIAL  3.  Gait up to 800' for community ambulation Baseline: 500' Goal status: INITIAL    PLAN:  PT FREQUENCY: 2x/week  PT DURATION: 8 weeks  PLANNED INTERVENTIONS: 97110-Therapeutic exercises, 97530- Therapeutic activity, 97112- Neuromuscular re-education, 97535- Self Care, and 16109- Manual therapy  PLAN FOR NEXT SESSION:  initiate cardiovascular equipment, strengthening LE's   Nikoletta Varma L Kellie Murrill, PT, DPT, OCS 04/28/2023, 5:54 PM

## 2023-05-04 ENCOUNTER — Encounter: Payer: Self-pay | Admitting: Physical Therapy

## 2023-05-04 ENCOUNTER — Ambulatory Visit: Attending: Internal Medicine | Admitting: Physical Therapy

## 2023-05-04 DIAGNOSIS — R2689 Other abnormalities of gait and mobility: Secondary | ICD-10-CM | POA: Insufficient documentation

## 2023-05-04 DIAGNOSIS — M6281 Muscle weakness (generalized): Secondary | ICD-10-CM | POA: Diagnosis not present

## 2023-05-04 DIAGNOSIS — M79675 Pain in left toe(s): Secondary | ICD-10-CM | POA: Insufficient documentation

## 2023-05-04 DIAGNOSIS — R293 Abnormal posture: Secondary | ICD-10-CM | POA: Diagnosis not present

## 2023-05-04 DIAGNOSIS — R2681 Unsteadiness on feet: Secondary | ICD-10-CM | POA: Diagnosis not present

## 2023-05-04 NOTE — Therapy (Signed)
 OUTPATIENT PHYSICAL THERAPY LOWER EXTREMITY TREATMENT   Patient Name: Debra Hudson MRN: [id number] DOB:[birth year], 88 y.o., female Today's Date: 05/04/2023  END OF SESSION:  PT End of Session - 05/04/23 0921     Visit Number 2    Date for PT Re-Evaluation 06/23/23    PT Start Time 0923    PT Stop Time 1010    PT Time Calculation (min) 47 min    Activity Tolerance Patient tolerated treatment well    Behavior During Therapy [facility] for tasks assessed/performed             Past Medical History:  Diagnosis Date   Diabetes mellitus without complication (HCC)    Hypertension    Past Surgical History:  Procedure Laterality Date   APPENDECTOMY     FOOT SURGERY Left    2 toe removed   Patient Active Problem List   Diagnosis Date Noted   Urinary urgency 08/22/2020   Anxiety 08/22/2020   Atrophic vaginitis 08/22/2020   Dysuria 08/22/2020   Female stress incontinence 08/22/2020   Closed fracture of right side of maxilla with routine healing 05/21/2020    PCP: Hillard Danker, MD  REFERRING PROVIDER: same  REFERRING DIAG: unsteady gait  THERAPY DIAG:  Unsteadiness on feet  Abnormal posture  Muscle weakness (generalized)  Other abnormalities of gait and mobility  Pain in left toe(s)  Rationale for Evaluation and Treatment: Rehabilitation  ONSET DATE: chronic, worse last year  SUBJECTIVE:   SUBJECTIVE STATEMENT: I use my cane mostly, wobbly with and without it  PERTINENT HISTORY: H/O balance deficits, unsteady gait, large fall 2 yrs ago.  PAIN:  Are you having pain? No  PRECAUTIONS: Fall  RED FLAGS: None   WEIGHT BEARING RESTRICTIONS: No  FALLS:  Has patient fallen in last 6 months? No  LIVING ENVIRONMENT: Lives with: lives with their family Lives in: House/apartment Stairs: No Has following equipment at home: Single point cane  OCCUPATION: retired  PLOF: Independent  PATIENT GOALS: get more mobile, steady on my feet   NEXT MD VISIT: 3  months  OBJECTIVE:  Note: Objective measures were completed at Evaluation unless otherwise noted.  DIAGNOSTIC FINDINGS: na    COGNITION: Overall cognitive status: Within functional limits for tasks assessed     SENSATION: Reports intermittent neuropathy Sx L lower leg    POSTURE: scoliotic posture with L trunk / lumbar concavity and maintains cervical spine in L side bent position  PALPATION: na  LOWER EXTREMITY ROM: grossly wfl  Active ROM Right eval Left eval  Hip flexion    Hip extension    Hip abduction    Hip adduction    Hip internal rotation    Hip external rotation    Knee flexion    Knee extension    Ankle dorsiflexion    Ankle plantarflexion    Ankle inversion    Ankle eversion     (Blank rows = not tested)  LOWER EXTREMITY MMT:  MMT Right eval Left eval  Hip flexion 4 4-  Hip extension 4- 4-  Hip abduction 4- 3+  Hip adduction    Hip internal rotation    Hip external rotation    Knee flexion 4 4-  Knee extension 4- 4-  Ankle dorsiflexion 4- 4-  Ankle plantarflexion 3+ 3+  Ankle inversion    Ankle eversion     (Blank rows = not tested)  FUNCTIONAL TESTS:  Timed up and go (TUG): without cane: 17.07 , with cane 13.79  BERG:34/56   GAIT: Distance walked: 600' in 5 min 9 sec Assistive device utilized: Single point cane Level of assistance: CGA Comments: slower gait, increased sway out of path noted at 450'                                                                                                                                TREATMENT DATE: 05/04/23 Nustep level 5 x 6 minutes Gait outside half parking [place] with HHA, scuffed feet often Leg curls 20# 2x10 LAQ 3# 2 x10 3# marches 3# hip abduction Ball toss and then ball toss on airex staying close to her 4" alternating toe taps solid and then on airex Direction changes    04/28/23:  evaluation   PATIENT EDUCATION:  Education details: POC, goals Person educated:  Patient Education method: Explanation, Demonstration, and Tactile cues Education comprehension: verbalized understanding, returned demonstration, verbal cues required, and tactile cues required  HOME EXERCISE PROGRAM: Access Code: [email or web address] URL: [email or web address] Date: 05/04/2023 Prepared by: Stacie Glaze  Exercises - Standing March with Counter Support  - 1 x daily - 7 x weekly - 2 sets - 10 reps - 3 hold - Standing Hip Extension with Unilateral Counter Support  - 1 x daily - 7 x weekly - 2 sets - 10 reps - 3 hold - Standing Hip Abduction with Unilateral Counter Support  - 1 x daily - 7 x weekly - 2 sets - 10 reps - 3 hold - Heel Toe Raises with Unilateral Counter Support  - 1 x daily - 7 x weekly - 2 sets - 10 reps - 3 hold  ASSESSMENT:  CLINICAL IMPRESSION: We started exercises and balance today, she does well with the exercises but has a lot of fear and hesitancy with balance activities.  She did need a few times with CGA due to LOB.  No pain with activities.  I also added HEP  Patient is a 88 y.o. female who was evaluated by physical therapy for recent decline in her balance, gait. She has lost her husband within the last 2 years and has moved into an addition at her daughter's home, has become less active.  Concerned about falling, wants to improve her overall endurance and balance.  Scored 34/56 on Berg test indicating higher risk of falls, also TUG without device, greater than 14 sec.  She does not typically use device in the home.  Will benefit from skilled PT to address her deficits and improve her overall physical function, safety  OBJECTIVE IMPAIRMENTS: decreased activity tolerance, decreased balance, difficulty walking, decreased strength, impaired perceived functional ability, and postural dysfunction.   ACTIVITY LIMITATIONS: carrying, lifting, standing, stairs, transfers, and locomotion level  PARTICIPATION LIMITATIONS: meal prep, cleaning, laundry,  shopping, community activity, and yard work  PERSONAL FACTORS: Age, Fitness, Past/current experiences, Time since onset of injury/illness/exacerbation, and 1-2 comorbidities: peripheral neuropathy, depression  are also affecting patient's functional outcome.   REHAB POTENTIAL: Good  CLINICAL DECISION MAKING: Evolving/moderate complexity  EVALUATION COMPLEXITY: Moderate   GOALS: Goals reviewed with patient? Yes  SHORT TERM GOALS: Target date: 2 weeks 4/9//25 I HEP Baseline: Goal status: INITIAL   LONG TERM GOALS: Target date: 06/23/23, 8 weeks  Berg 34/56 improve to 45/56 or greater Baseline:  Goal status: INITIAL  2.  TUG without device 14 sec or less Baseline: 17.07 Goal status: INITIAL  3.  Gait up to 800' for community ambulation Baseline: 500' Goal status: INITIAL    PLAN:  PT FREQUENCY: 2x/week  PT DURATION: 8 weeks  PLANNED INTERVENTIONS: 97110-Therapeutic exercises, 97530- Therapeutic activity, 97112- Neuromuscular re-education, 97535- Self Care, and 86578- Manual therapy  PLAN FOR NEXT SESSION: initiate cardiovascular equipment, strengthening LE's and balance    Akima Slaugh W, PT 05/04/2023, 9:22 AM

## 2023-05-06 ENCOUNTER — Ambulatory Visit

## 2023-05-10 ENCOUNTER — Ambulatory Visit: Admitting: Physical Therapy

## 2023-05-11 DIAGNOSIS — N179 Acute kidney failure, unspecified: Secondary | ICD-10-CM | POA: Diagnosis not present

## 2023-05-12 ENCOUNTER — Ambulatory Visit: Admitting: Physical Therapy

## 2023-05-15 IMAGING — CT CT HEAD W/O CM
1 series · 16 of 30 positions shown, 20 images · non-contrast
Comparison: CT head dated September 25, 2020

CLINICAL DATA: History of fall

EXAM:
CT HEAD WITHOUT CONTRAST
TECHNIQUE: Contiguous axial images were obtained from the base of the skull
through the vertex without intravenous contrast.

[Series 2: head w/(date) · axial · 0.49mm/px · z∈[-46,+104]mm · 16 of 34 slices shown, 20 images]
[im 2/34  brain]
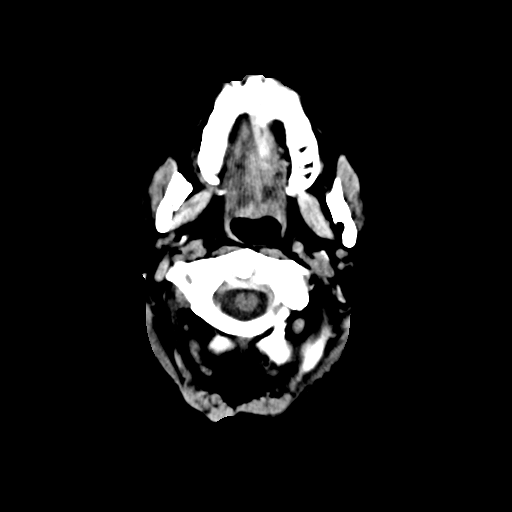
[im 2/34  bone]
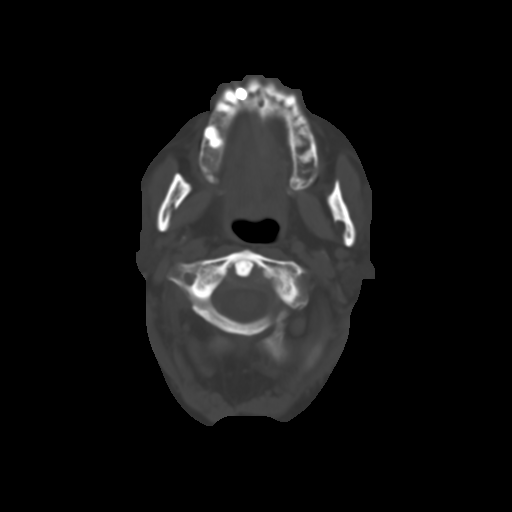
[im 4/34  brain]
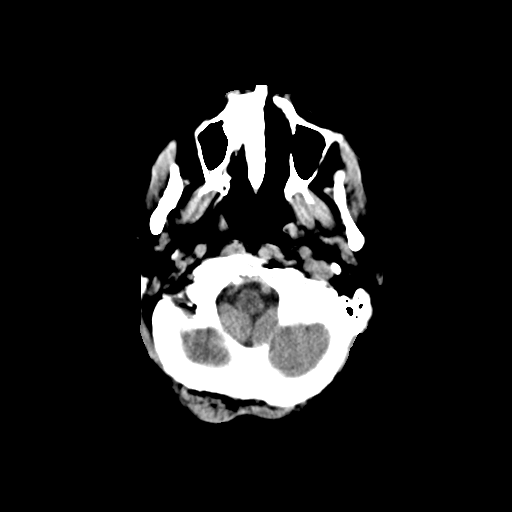
[im 6/34  brain]
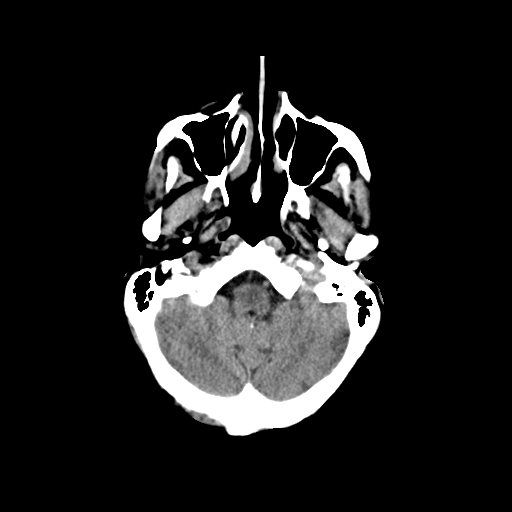
[im 8/34  brain]
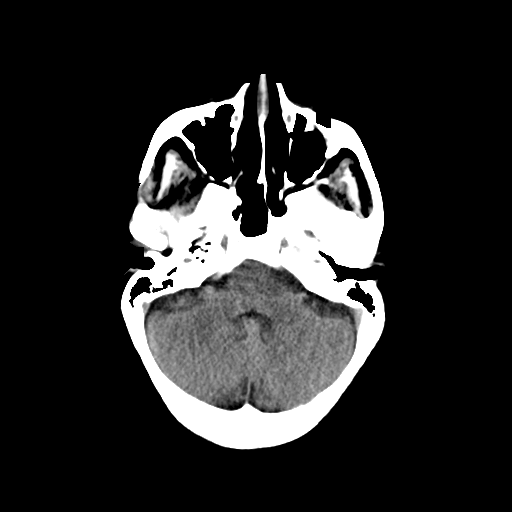
[im 10/34  brain]
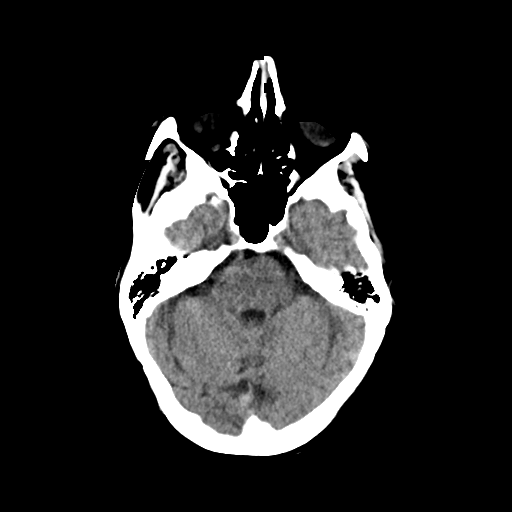
[im 10/34  bone]
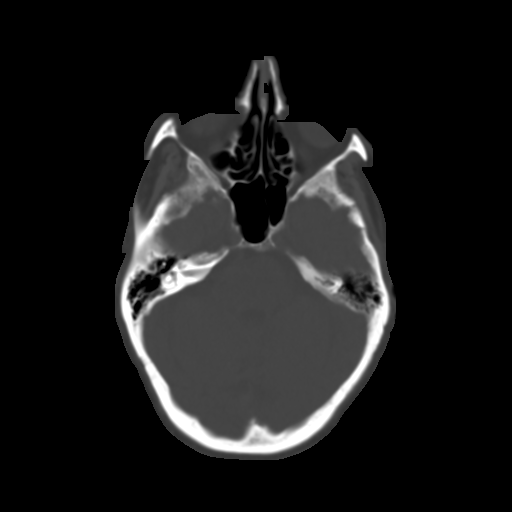
[im 12/34  brain]
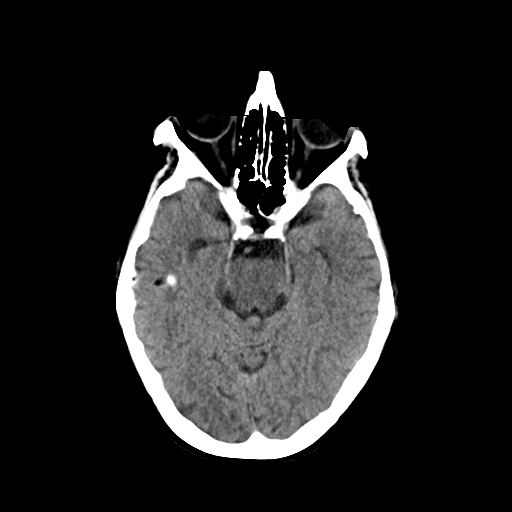
[im 14/34  brain]
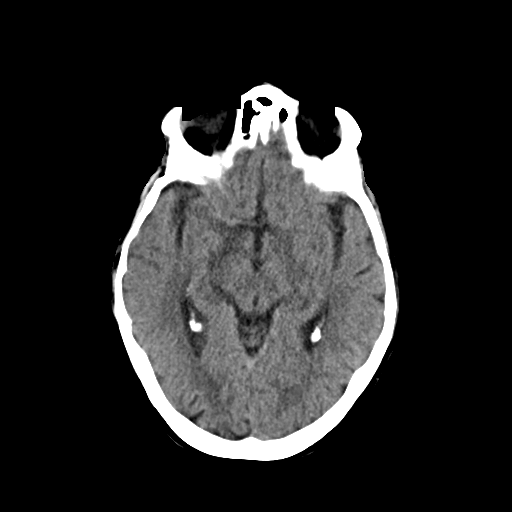
[im 16/34  brain]
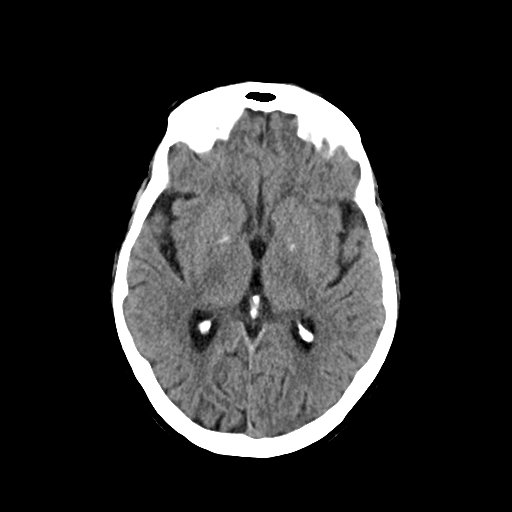
[im 18/34  brain]
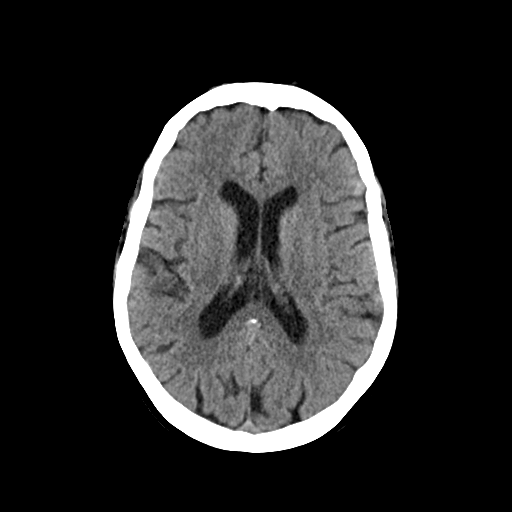
[im 18/34  bone]
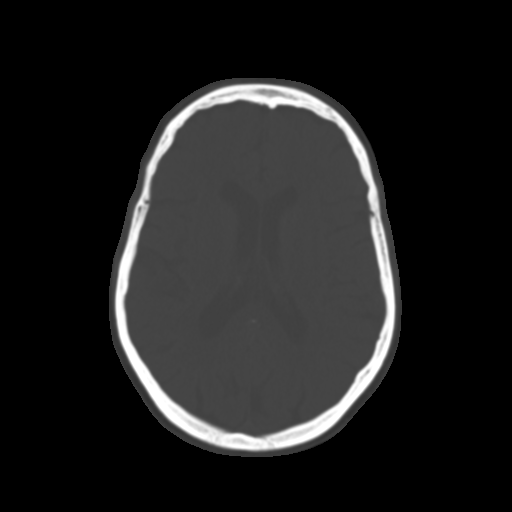
[im 20/34  brain]
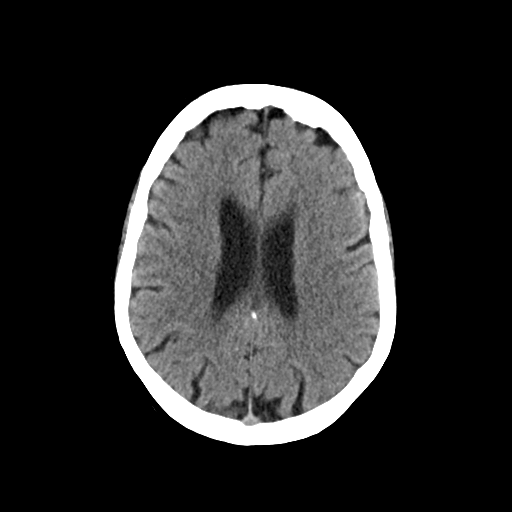
[im 22/34  brain]
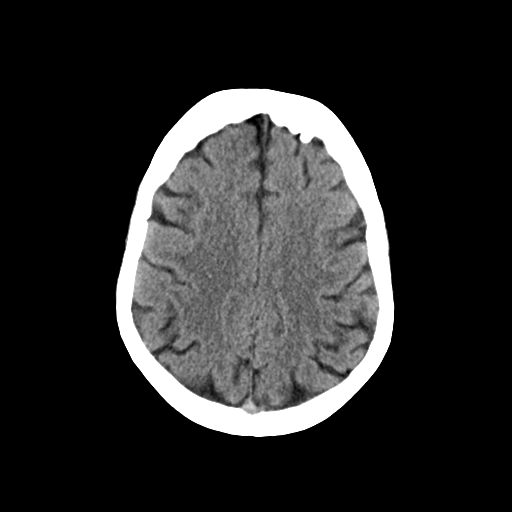
[im 24/34  brain]
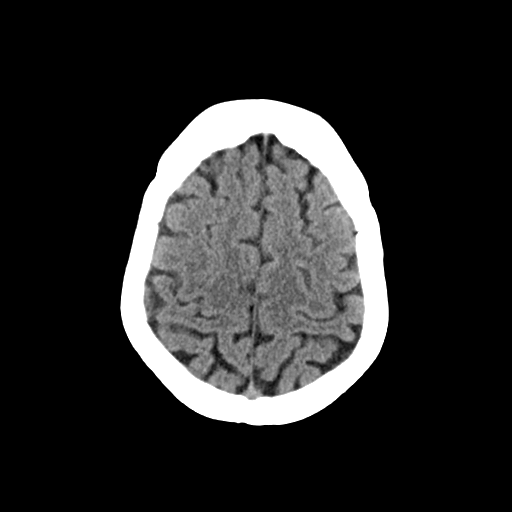
[im 26/34  brain]
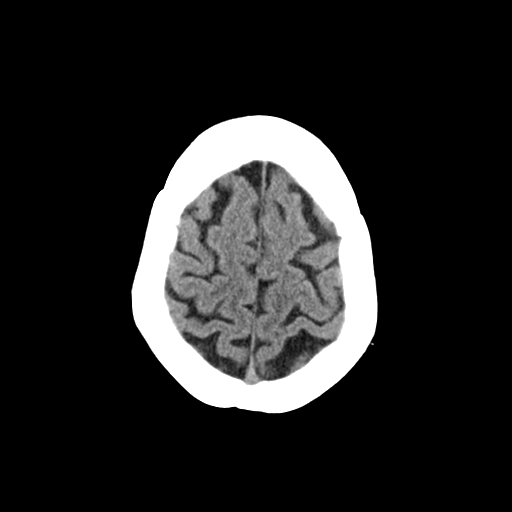
[im 26/34  bone]
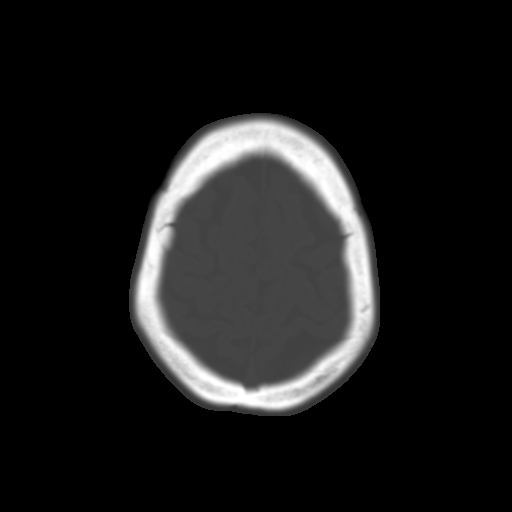
[im 28/34  brain]
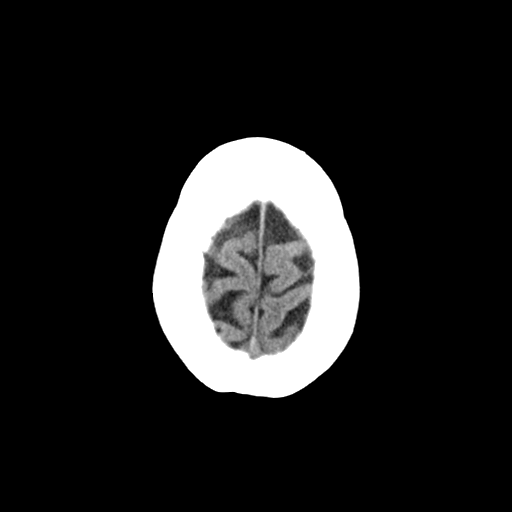
[im 30/34  brain]
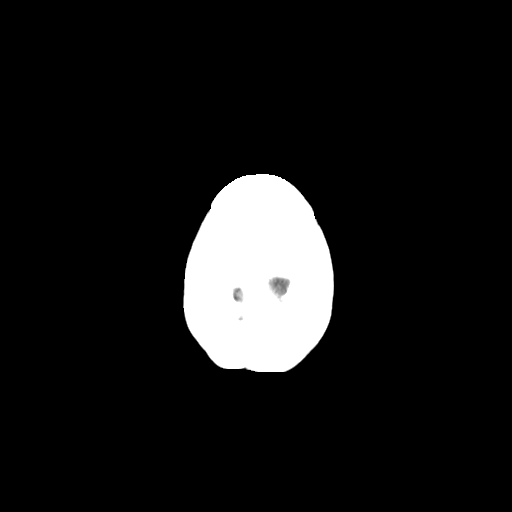
[im 32/34  brain]
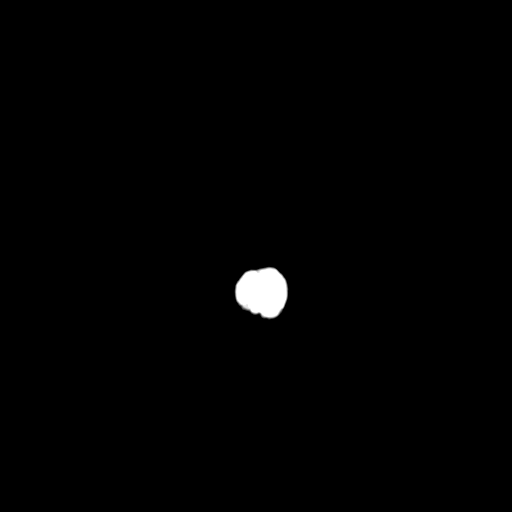

[16 of 30 positions shown; findings below may reference images not displayed]

FINDINGS: Brain: No evidence of acute infarction, hemorrhage, hydrocephalus,
extra-axial collection or mass lesion/mass effect.

Vascular: No hyperdense vessel or unexpected calcification.

Skull: Normal. Negative for fracture or focal lesion.

Sinuses/Orbits: No acute finding.

Other: None.
IMPRESSION: No acute intracranial abnormality.

## 2023-05-17 ENCOUNTER — Ambulatory Visit: Admitting: Physical Therapy

## 2023-05-19 ENCOUNTER — Ambulatory Visit: Admitting: Physical Therapy

## 2023-05-19 DIAGNOSIS — H401223 Low-tension glaucoma, left eye, severe stage: Secondary | ICD-10-CM | POA: Diagnosis not present

## 2023-05-19 DIAGNOSIS — H25812 Combined forms of age-related cataract, left eye: Secondary | ICD-10-CM | POA: Diagnosis not present

## 2023-05-24 ENCOUNTER — Ambulatory Visit: Admitting: Physical Therapy

## 2023-05-24 DIAGNOSIS — E1136 Type 2 diabetes mellitus with diabetic cataract: Secondary | ICD-10-CM | POA: Diagnosis not present

## 2023-05-24 DIAGNOSIS — E1139 Type 2 diabetes mellitus with other diabetic ophthalmic complication: Secondary | ICD-10-CM | POA: Diagnosis not present

## 2023-05-24 DIAGNOSIS — H401223 Low-tension glaucoma, left eye, severe stage: Secondary | ICD-10-CM | POA: Diagnosis not present

## 2023-05-24 DIAGNOSIS — H25812 Combined forms of age-related cataract, left eye: Secondary | ICD-10-CM | POA: Diagnosis not present

## 2023-05-27 ENCOUNTER — Ambulatory Visit: Admitting: Physical Therapy

## 2023-05-31 ENCOUNTER — Ambulatory Visit: Admitting: Physical Therapy

## 2023-06-03 ENCOUNTER — Ambulatory Visit: Admitting: Physical Therapy

## 2023-06-10 DIAGNOSIS — F324 Major depressive disorder, single episode, in partial remission: Secondary | ICD-10-CM | POA: Diagnosis not present

## 2023-06-10 DIAGNOSIS — F419 Anxiety disorder, unspecified: Secondary | ICD-10-CM | POA: Diagnosis not present

## 2023-07-14 DIAGNOSIS — Z1231 Encounter for screening mammogram for malignant neoplasm of breast: Secondary | ICD-10-CM | POA: Diagnosis not present

## 2023-07-14 DIAGNOSIS — Z01419 Encounter for gynecological examination (general) (routine) without abnormal findings: Secondary | ICD-10-CM | POA: Diagnosis not present

## 2023-07-23 DIAGNOSIS — R300 Dysuria: Secondary | ICD-10-CM | POA: Diagnosis not present

## 2023-08-31 DIAGNOSIS — R300 Dysuria: Secondary | ICD-10-CM | POA: Diagnosis not present

## 2023-09-06 DIAGNOSIS — N952 Postmenopausal atrophic vaginitis: Secondary | ICD-10-CM | POA: Diagnosis not present

## 2023-09-06 DIAGNOSIS — N761 Subacute and chronic vaginitis: Secondary | ICD-10-CM | POA: Diagnosis not present

## 2023-09-06 DIAGNOSIS — M545 Low back pain, unspecified: Secondary | ICD-10-CM | POA: Diagnosis not present

## 2023-09-06 DIAGNOSIS — R300 Dysuria: Secondary | ICD-10-CM | POA: Diagnosis not present

## 2023-09-21 DIAGNOSIS — N760 Acute vaginitis: Secondary | ICD-10-CM | POA: Diagnosis not present

## 2023-09-30 DIAGNOSIS — D692 Other nonthrombocytopenic purpura: Secondary | ICD-10-CM | POA: Diagnosis not present

## 2023-10-12 DIAGNOSIS — N9089 Other specified noninflammatory disorders of vulva and perineum: Secondary | ICD-10-CM | POA: Diagnosis not present

## 2023-10-19 DIAGNOSIS — H401211 Low-tension glaucoma, right eye, mild stage: Secondary | ICD-10-CM | POA: Diagnosis not present

## 2023-10-28 DIAGNOSIS — R413 Other amnesia: Secondary | ICD-10-CM | POA: Diagnosis not present

## 2023-10-28 DIAGNOSIS — I10 Essential (primary) hypertension: Secondary | ICD-10-CM | POA: Diagnosis not present

## 2023-10-28 DIAGNOSIS — F324 Major depressive disorder, single episode, in partial remission: Secondary | ICD-10-CM | POA: Diagnosis not present

## 2023-10-28 DIAGNOSIS — E1169 Type 2 diabetes mellitus with other specified complication: Secondary | ICD-10-CM | POA: Diagnosis not present

## 2023-10-28 DIAGNOSIS — N390 Urinary tract infection, site not specified: Secondary | ICD-10-CM | POA: Diagnosis not present

## 2023-10-28 DIAGNOSIS — Z23 Encounter for immunization: Secondary | ICD-10-CM | POA: Diagnosis not present

## 2023-10-28 DIAGNOSIS — F419 Anxiety disorder, unspecified: Secondary | ICD-10-CM | POA: Diagnosis not present

## 2023-12-06 DIAGNOSIS — L03119 Cellulitis of unspecified part of limb: Secondary | ICD-10-CM | POA: Diagnosis not present

## 2023-12-10 DIAGNOSIS — R600 Localized edema: Secondary | ICD-10-CM | POA: Diagnosis not present

## 2023-12-10 DIAGNOSIS — I872 Venous insufficiency (chronic) (peripheral): Secondary | ICD-10-CM | POA: Diagnosis not present

## 2023-12-10 DIAGNOSIS — L03119 Cellulitis of unspecified part of limb: Secondary | ICD-10-CM | POA: Diagnosis not present

## 2023-12-16 DIAGNOSIS — I872 Venous insufficiency (chronic) (peripheral): Secondary | ICD-10-CM | POA: Diagnosis not present

## 2023-12-16 DIAGNOSIS — Z79899 Other long term (current) drug therapy: Secondary | ICD-10-CM | POA: Diagnosis not present

## 2023-12-18 ENCOUNTER — Other Ambulatory Visit: Payer: Self-pay

## 2023-12-18 ENCOUNTER — Emergency Department (HOSPITAL_BASED_OUTPATIENT_CLINIC_OR_DEPARTMENT_OTHER)
Admission: EM | Admit: 2023-12-18 | Discharge: 2023-12-18 | Disposition: A | Discharge status: Home / Self Care | Attending: Emergency Medicine | Admitting: Emergency Medicine

## 2023-12-18 ENCOUNTER — Encounter (HOSPITAL_BASED_OUTPATIENT_CLINIC_OR_DEPARTMENT_OTHER): Payer: Self-pay | Admitting: Emergency Medicine

## 2023-12-18 ENCOUNTER — Emergency Department (HOSPITAL_BASED_OUTPATIENT_CLINIC_OR_DEPARTMENT_OTHER)

## 2023-12-18 DIAGNOSIS — I10 Essential (primary) hypertension: Secondary | ICD-10-CM | POA: Diagnosis not present

## 2023-12-18 DIAGNOSIS — M25512 Pain in left shoulder: Secondary | ICD-10-CM | POA: Insufficient documentation

## 2023-12-18 DIAGNOSIS — Y92481 Parking lot as the place of occurrence of the external cause: Secondary | ICD-10-CM | POA: Diagnosis not present

## 2023-12-18 DIAGNOSIS — M25562 Pain in left knee: Secondary | ICD-10-CM | POA: Insufficient documentation

## 2023-12-18 DIAGNOSIS — W19XXXA Unspecified fall, initial encounter: Secondary | ICD-10-CM | POA: Diagnosis not present

## 2023-12-18 DIAGNOSIS — E119 Type 2 diabetes mellitus without complications: Secondary | ICD-10-CM | POA: Diagnosis not present

## 2023-12-18 DIAGNOSIS — Z7982 Long term (current) use of aspirin: Secondary | ICD-10-CM | POA: Insufficient documentation

## 2023-12-18 DIAGNOSIS — Z79899 Other long term (current) drug therapy: Secondary | ICD-10-CM | POA: Diagnosis not present

## 2023-12-18 DIAGNOSIS — M25561 Pain in right knee: Secondary | ICD-10-CM | POA: Diagnosis not present

## 2023-12-18 DIAGNOSIS — M25522 Pain in left elbow: Secondary | ICD-10-CM | POA: Diagnosis not present

## 2023-12-18 DIAGNOSIS — Z7984 Long term (current) use of oral hypoglycemic drugs: Secondary | ICD-10-CM | POA: Insufficient documentation

## 2023-12-18 DIAGNOSIS — M79622 Pain in left upper arm: Secondary | ICD-10-CM | POA: Diagnosis not present

## 2023-12-18 DIAGNOSIS — M79602 Pain in left arm: Secondary | ICD-10-CM

## 2023-12-18 DIAGNOSIS — M79632 Pain in left forearm: Secondary | ICD-10-CM | POA: Insufficient documentation

## 2023-12-18 DIAGNOSIS — M25572 Pain in left ankle and joints of left foot: Secondary | ICD-10-CM | POA: Insufficient documentation

## 2023-12-18 MED ORDER — OXYCODONE-ACETAMINOPHEN 5-325 MG PO TABS
1.0000 | ORAL_TABLET | Freq: Once | ORAL | Status: AC
  Administered 2023-12-18: 1 via ORAL
  Filled 2023-12-18: qty 1

## 2023-12-18 NOTE — ED Notes (Signed)
 Imaging at bedside.

## 2023-12-18 NOTE — ED Provider Notes (Signed)
 [place] [facility] Provider Note   CSN: [id number] Arrival date & time: 12/18/23  1647     Patient presents with: Debra Hudson is a 88 y.o. female.   Patient is an 88 year old female with a past medical history of hypertension, diabetes presenting to the emergency department after a fall.  Patient states that she was getting out of the car to go out to dinner, her son-in-law reached back in to get her cane and when he turned around she had lost her balance and fell.  She states that she fell onto her left side.  She denies hitting her head or losing consciousness.  Denies any lightheadedness or dizziness prior to the fall.  She denies any blood thinner use.  She states that she is having significant pain from her left shoulder, elbow and forearm, bilateral knees and left ankle.  She states that she was able to stand and ambulate with assistance after the fall.  She denies any associated numbness or weakness.  The history is provided by the patient and a relative.  Fall       Prior to Admission medications   Medication Sig Start Date End Date Taking? Authorizing Provider  aspirin EC 81 MG tablet Take 81 mg by mouth daily.    [provider]  citalopram (CELEXA) 20 MG tablet Take 20 mg by mouth daily. 08/19/20   [provider]  furosemide (LASIX) 40 MG tablet Take 40 mg by mouth daily. 08/07/20   [provider]  latanoprost (XALATAN) 0.005 % ophthalmic solution 1 drop at bedtime. 07/04/20   [provider]  LORazepam (ATIVAN) 1 MG tablet Take 1 mg by mouth 2 (two) times daily as needed. 07/11/20   [provider]  losartan-hydrochlorothiazide (HYZAAR) 100-25 MG tablet Take 1 tablet by mouth daily. 05/01/15   [provider]  metFORMIN (GLUCOPHAGE-XR) 500 MG 24 hr tablet Take 500 mg by mouth 2 (two) times daily. 08/02/20   [provider]  metoprolol tartrate (LOPRESSOR) 25 MG tablet  Take 25 mg by mouth 2 (two) times daily. 04/15/15   [provider]  potassium chloride  (MICRO-K ) 10 MEQ CR capsule Take 10 mEq by mouth daily. 03/12/15   [provider]  SIMBRINZA 1-0.2 % SUSP Apply 1 drop to eye 2 (two) times daily. 04/11/20   [provider]  simvastatin (ZOCOR) 40 MG tablet Take 1 tablet by mouth at bedtime.    [provider]  valsartan-hydrochlorothiazide (DIOVAN-HCT) 160-12.5 MG tablet Take 1 tablet by mouth daily. 06/19/20   [provider]    Allergies: Erythromycin, Lisinopril, and Sertraline hcl    Review of Systems  Updated Vital Signs BP (!) 141/58 (BP Location: Right Arm)   Pulse (!) 54   Temp 98.6 F (37 C) (Oral)   Resp 18   SpO2 93%   Physical Exam Vitals and nursing note reviewed.  Constitutional:      General: She is not in acute distress.    Appearance: Normal appearance.  HENT:     Head: Normocephalic.     Nose: Nose normal.     Mouth/Throat:     Mouth: Mucous membranes are moist.     Pharynx: Oropharynx is clear.  Eyes:     Extraocular Movements: Extraocular movements intact.     Conjunctiva/sclera: Conjunctivae normal.  Neck:     Comments: No midline neck tenderness Cardiovascular:     Rate and Rhythm: Normal  rate and regular rhythm.     Pulses: Normal pulses.     Heart sounds: Normal heart sounds.  Pulmonary:     Effort: Pulmonary effort is normal.     Breath sounds: Normal breath sounds.  Abdominal:     General: Abdomen is flat.     Palpations: Abdomen is soft.     Tenderness: There is no abdominal tenderness.  Musculoskeletal:     Cervical back: Normal range of motion and neck supple.     Comments: No midline back tenderness No bony tenderness to RUE Tenderness to palpation of L humerus, L elbow and L forearm, no visible deformity, limited ROM 2/2 pain  Pelvis stable, non-tender No bony tenderness in RLE Tenderness with mild swelling to L ankle  Skin:    General: Skin is warm and  dry.     Findings: No bruising or lesion.  Neurological:     General: No focal deficit present.     Mental Status: She is alert and oriented to person, place, and time.  Psychiatric:        Mood and Affect: Mood normal.        Behavior: Behavior normal.     (all labs ordered are listed, but only abnormal results are displayed) Labs Reviewed - No data to display  EKG: None  Radiology: DG Humerus Left Result Date: 12/18/2023 CLINICAL DATA:  Fall with pain in left upper arm, left forearm, and left ankle. EXAM: LEFT ANKLE COMPLETE - 3+ VIEW; LEFT FOREARM - 2 VIEW; LEFT HUMERUS - 2+ VIEW COMPARISON:  None Available. FINDINGS: Left humerus: There is no evidence of fracture, dislocation, or joint effusion. Mild degenerative changes are present at the glenohumeral joint and elbow. Soft tissues are unremarkable. Left forearm: There is no evidence of acute fracture or dislocation. Degenerative changes are present at the wrist and elbow. No joint effusion at the elbow. The soft tissues are unremarkable. Left ankle: There is no evidence of acute fracture or dislocation. Moderate calcaneal spurring is noted. There are degenerative changes in the midfoot. A surgical screw is present in the first metatarsal. Soft tissue swelling is noted about the ankle. IMPRESSION: No acute fracture or dislocation at the left humerus, forearm, or ankle. Electronically Signed   By: Leita Birmingham M.D.   On: 12/18/2023 17:51   DG Forearm Left Result Date: 12/18/2023 CLINICAL DATA:  Fall with pain in left upper arm, left forearm, and left ankle. EXAM: LEFT ANKLE COMPLETE - 3+ VIEW; LEFT FOREARM - 2 VIEW; LEFT HUMERUS - 2+ VIEW COMPARISON:  None Available. FINDINGS: Left humerus: There is no evidence of fracture, dislocation, or joint effusion. Mild degenerative changes are present at the glenohumeral joint and elbow. Soft tissues are unremarkable. Left forearm: There is no evidence of acute fracture or dislocation.  Degenerative changes are present at the wrist and elbow. No joint effusion at the elbow. The soft tissues are unremarkable. Left ankle: There is no evidence of acute fracture or dislocation. Moderate calcaneal spurring is noted. There are degenerative changes in the midfoot. A surgical screw is present in the first metatarsal. Soft tissue swelling is noted about the ankle. IMPRESSION: No acute fracture or dislocation at the left humerus, forearm, or ankle. Electronically Signed   By: Leita Birmingham M.D.   On: 12/18/2023 17:51   DG Ankle Complete Left Result Date: 12/18/2023 CLINICAL DATA:  Fall with pain in left upper arm, left forearm, and left ankle. EXAM: LEFT ANKLE COMPLETE - 3+ VIEW;  LEFT FOREARM - 2 VIEW; LEFT HUMERUS - 2+ VIEW COMPARISON:  None Available. FINDINGS: Left humerus: There is no evidence of fracture, dislocation, or joint effusion. Mild degenerative changes are present at the glenohumeral joint and elbow. Soft tissues are unremarkable. Left forearm: There is no evidence of acute fracture or dislocation. Degenerative changes are present at the wrist and elbow. No joint effusion at the elbow. The soft tissues are unremarkable. Left ankle: There is no evidence of acute fracture or dislocation. Moderate calcaneal spurring is noted. There are degenerative changes in the midfoot. A surgical screw is present in the first metatarsal. Soft tissue swelling is noted about the ankle. IMPRESSION: No acute fracture or dislocation at the left humerus, forearm, or ankle. Electronically Signed   By: Leita Birmingham M.D.   On: 12/18/2023 17:51     Procedures   Medications Ordered in the ED  oxyCODONE-acetaminophen  (PERCOCET/ROXICET) 5-325 MG per tablet 1 tablet (1 tablet Oral Given 12/18/23 1716)    Clinical Course as of 12/18/23 1824  Sat Dec 18, 2023  1756 No acute traumatic injury in LUE or L ankle. Patient is stable for discharge home with outpatient follow up. [VK]    Clinical Course User  Index [VK] Kingsley, Fayola Meckes K, DO                                 Medical Decision Making This patient presents to the ED with chief complaint(s) of fall with pertinent past medical history of HTN, DM, which further complicates the presenting complaint. The complaint involves an extensive differential diagnosis and also carries with it a high risk of complications and morbidity.    The differential diagnosis includes shoulder fracture, humerus fracture, elbow fracture, forearm fracture, ankle fracture, sprain, no other traumatic injuries seen on exam, no evidence of neurovascular injury  Additional history obtained: Additional history obtained from family Records reviewed Care Everywhere/External Records  ED Course and Reassessment: On patient's arrival she is hemodynamically stable, uncomfortable appearing but in no acute distress.  The patient does have significant tenderness to the left upper extremity and left ankle.  Will do x-rays performed to evaluate for traumatic injury.  Was given pain control and was closely reassessed.  Independent labs interpretation:  N/A  Independent visualization of imaging: - I independently visualized the following imaging with scope of interpretation limited to determining acute life threatening conditions related to emergency care: L humerus XR, L forearm XR, L ankle XR, which revealed no acute traumatic injury  Consultation: - Consulted or discussed management/test interpretation w/ external professional: N/A  Consideration for admission or further workup: Patient has no emergent conditions requiring admission or further work-up at this time and is stable for discharge home with primary care follow-up  Social Determinants of health: N/A    Amount and/or Complexity of Data Reviewed Radiology: ordered.  Risk Prescription drug management.       Final diagnoses:  Fall, initial encounter  Diffuse pain in left upper extremity  Acute left  ankle pain    ED Discharge Orders     None          Ellouise Richerd POUR, DO 12/18/23 1824

## 2023-12-18 NOTE — ED Triage Notes (Signed)
 Pt got out of car at restaurant parking lot, son in law turned to get her cane and pt lost balance falling backward onto bilateral forearms.  No LOC.  Did not hit head. No thinners.  Pain to left shoulder/humerus/elbow. Pt tearful d/t pain.

## 2023-12-18 NOTE — ED Notes (Signed)
D/c paperwork reviewed with pt, including follow up care.  All questions and/or concerns addressed at time of d/c.  No further needs expressed. . Pt verbalized understanding, Wheeled by ED staff to ED exit, NAD.   

## 2023-12-18 NOTE — Discharge Instructions (Signed)
 You were seen in the emergency department after your fall.  Your workup showed no broken or dislocated bones.  You likely bruised your arm and possibly your ankle.  You should continue to ice for the next 3 days and then heat thereafter and should try to range your shoulder and your elbow to prevent your muscles from getting stiff.  You can take Tylenol  every 6 hours as needed for pain.  You should follow-up with your primary doctor to have your symptoms rechecked.  You should return to the emergency department if you are having significantly worsening pain, you develop numbness or weakness in your arms or legs or any other new or concerning symptoms.
